# Patient Record
Sex: Male | Born: 1980 | Race: Black or African American | Hispanic: No | Smoking: Current every day smoker
Health system: Southern US, Community
[De-identification: ages and names within clinical notes are randomized; demographics above are authoritative.]

---

## 2004-06-10 ENCOUNTER — Emergency Department (HOSPITAL_COMMUNITY): Admission: EM | Admit: 2004-06-10 | Discharge: 2004-06-10 | Payer: Self-pay | Admitting: Emergency Medicine

## 2005-01-23 ENCOUNTER — Emergency Department (HOSPITAL_COMMUNITY): Admission: EM | Admit: 2005-01-23 | Discharge: 2005-01-23 | Payer: Self-pay | Admitting: Emergency Medicine

## 2005-05-27 ENCOUNTER — Emergency Department (HOSPITAL_COMMUNITY): Admission: EM | Admit: 2005-05-27 | Discharge: 2005-05-27 | Payer: Self-pay | Admitting: Emergency Medicine

## 2005-06-05 ENCOUNTER — Emergency Department (HOSPITAL_COMMUNITY): Admission: EM | Admit: 2005-06-05 | Discharge: 2005-06-05 | Payer: Self-pay | Admitting: Emergency Medicine

## 2005-06-06 ENCOUNTER — Emergency Department (HOSPITAL_COMMUNITY): Admission: EM | Admit: 2005-06-06 | Discharge: 2005-06-06 | Payer: Self-pay | Admitting: Emergency Medicine

## 2005-07-15 ENCOUNTER — Emergency Department (HOSPITAL_COMMUNITY): Admission: EM | Admit: 2005-07-15 | Discharge: 2005-07-16 | Payer: Self-pay | Admitting: Emergency Medicine

## 2005-07-17 ENCOUNTER — Emergency Department (HOSPITAL_COMMUNITY): Admission: EM | Admit: 2005-07-17 | Discharge: 2005-07-18 | Payer: Self-pay | Admitting: *Deleted

## 2005-07-26 ENCOUNTER — Emergency Department (HOSPITAL_COMMUNITY): Admission: EM | Admit: 2005-07-26 | Discharge: 2005-07-26 | Payer: Self-pay | Admitting: Emergency Medicine

## 2006-08-15 ENCOUNTER — Emergency Department (HOSPITAL_COMMUNITY): Admission: EM | Admit: 2006-08-15 | Discharge: 2006-08-15 | Payer: Self-pay | Admitting: Emergency Medicine

## 2007-08-08 ENCOUNTER — Emergency Department (HOSPITAL_COMMUNITY): Admission: EM | Admit: 2007-08-08 | Discharge: 2007-08-08 | Payer: Self-pay | Admitting: Emergency Medicine

## 2007-09-14 ENCOUNTER — Emergency Department (HOSPITAL_COMMUNITY): Admission: EM | Admit: 2007-09-14 | Discharge: 2007-09-15 | Payer: Self-pay | Admitting: Emergency Medicine

## 2008-06-29 ENCOUNTER — Emergency Department (HOSPITAL_COMMUNITY): Admission: EM | Admit: 2008-06-29 | Discharge: 2008-06-29 | Payer: Self-pay | Admitting: Emergency Medicine

## 2008-08-15 ENCOUNTER — Emergency Department (HOSPITAL_COMMUNITY): Admission: EM | Admit: 2008-08-15 | Discharge: 2008-08-15 | Payer: Self-pay | Admitting: Emergency Medicine
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
## 2009-08-24 ENCOUNTER — Emergency Department (HOSPITAL_COMMUNITY): Admission: EM | Admit: 2009-08-24 | Discharge: 2009-08-24 | Payer: Self-pay | Admitting: Emergency Medicine

## 2010-03-04 ENCOUNTER — Emergency Department (HOSPITAL_COMMUNITY): Admission: EM | Admit: 2010-03-04 | Discharge: 2010-03-04 | Payer: Self-pay | Admitting: Emergency Medicine

## 2018-01-19 ENCOUNTER — Encounter (HOSPITAL_COMMUNITY): Payer: Self-pay | Admitting: Emergency Medicine

## 2018-01-19 ENCOUNTER — Emergency Department (HOSPITAL_COMMUNITY)
Admission: EM | Admit: 2018-01-19 | Discharge: 2018-01-19 | Disposition: A | Discharge status: Home / Self Care | Payer: Self-pay | Attending: Emergency Medicine | Admitting: Emergency Medicine

## 2018-01-19 ENCOUNTER — Other Ambulatory Visit: Payer: Self-pay

## 2018-01-19 DIAGNOSIS — F1910 Other psychoactive substance abuse, uncomplicated: Secondary | ICD-10-CM | POA: Insufficient documentation

## 2018-01-19 DIAGNOSIS — F17210 Nicotine dependence, cigarettes, uncomplicated: Secondary | ICD-10-CM | POA: Insufficient documentation

## 2018-01-19 LAB — CBC
HEMATOCRIT: 45.6 % (ref 39.0–52.0)
HEMOGLOBIN: 14.5 g/dL (ref 13.0–17.0)
MCH: 28.1 pg (ref 26.0–34.0)
MCHC: 31.8 g/dL (ref 30.0–36.0)
MCV: 88.4 fL (ref 78.0–100.0)
Platelets: 264 10*3/uL (ref 150–400)
RBC: 5.16 MIL/uL (ref 4.22–5.81)
RDW: 14.5 % (ref 11.5–15.5)
WBC: 4.6 10*3/uL (ref 4.0–10.5)

## 2018-01-19 LAB — COMPREHENSIVE METABOLIC PANEL
ALBUMIN: 4.3 g/dL (ref 3.5–5.0)
ALT: 15 U/L — ABNORMAL LOW (ref 17–63)
ANION GAP: 8 (ref 5–15)
AST: 21 U/L (ref 15–41)
Alkaline Phosphatase: 39 U/L (ref 38–126)
BUN: 13 mg/dL (ref 6–20)
CHLORIDE: 104 mmol/L (ref 101–111)
CO2: 27 mmol/L (ref 22–32)
Calcium: 9.4 mg/dL (ref 8.9–10.3)
Creatinine, Ser: 1.2 mg/dL (ref 0.61–1.24)
GFR calc Af Amer: >60 mL/min (ref >60)
GFR calc non Af Amer: >60 mL/min (ref >60)
GLUCOSE: 111 mg/dL — AB (ref 65–99)
Potassium: 4.5 mmol/L (ref 3.5–5.1)
SODIUM: 139 mmol/L (ref 135–145)
Total Bilirubin: 0.6 mg/dL (ref 0.3–1.2)
Total Protein: 7.4 g/dL (ref 6.5–8.1)

## 2018-01-19 LAB — RAPID URINE DRUG SCREEN, HOSP PERFORMED
AMPHETAMINES: NOT DETECTED
BARBITURATES: NOT DETECTED
Benzodiazepines: NOT DETECTED
COCAINE: POSITIVE — AB
Opiates: NOT DETECTED
TETRAHYDROCANNABINOL: POSITIVE — AB

## 2018-01-19 LAB — ETHANOL: Alcohol, Ethyl (B): <10 mg/dL (ref <10)

## 2018-01-19 NOTE — ED Triage Notes (Signed)
Pt reports he wants help getting into a rehab to become sober. He uses cocaine and marijuana. Last cocaine use was yesterday.

## 2018-01-19 NOTE — ED Provider Notes (Signed)
[facility] EMERGENCY DEPARTMENT Provider Note   CSN: [id number] Arrival date & time: 01/19/18  1105     History   Chief Complaint Chief Complaint  Patient presents with  . Medical Clearance    HPI Mark Manning is a 37 y.o. male.  Patient here requesting inpatient substance abuse treatment.  Patient with significant crack cocaine abuse pattern and also substance abuse with THC.  [facility] the [facility].  Patient denies any suicidal ideations.  Patient last used yesterday.      No past medical history on file.  There are no active problems to display for this patient.       Home Medications    Prior to Admission medications   Not on File    Family History No family history on file.  Social History Social History   Tobacco Use  . Smoking status: Current Every Day Smoker    Packs/day: 1.00  . Smokeless tobacco: Never Used  Substance Use Topics  . Alcohol use: Yes    Comment: occasionally  . Drug use: Yes    Types: Cocaine, Marijuana    Comment: last use 01/18/18     Allergies   Clindamycin/lincomycin   Review of Systems Review of Systems  Constitutional: Negative for fever.  HENT: Negative for congestion.   Eyes: Negative for redness.  Respiratory: Negative for shortness of breath.   Cardiovascular: Negative for chest pain.  Gastrointestinal: Negative for abdominal pain.  Genitourinary: Negative for dysuria.  Musculoskeletal: Negative for myalgias.  Neurological: Negative for headaches.  Hematological: Does not bruise/bleed easily.  Psychiatric/Behavioral: Negative for confusion and suicidal ideas.     Physical Exam Updated Vital Signs BP 120/74   Pulse 74   Temp 98.2 F (36.8 C) (Oral)   Resp 16   Ht 1.778 m (5\' 10" )   Wt 90.7 kg (200 lb)   SpO2 99%   BMI 28.70 kg/m   Physical Exam  Constitutional: He is oriented to person, place, and time. He appears well-developed and well-nourished.  No distress.  HENT:  Head: Normocephalic and atraumatic.  Eyes: Conjunctivae and EOM are normal. Pupils are equal, round, and reactive to light.  Neck: Neck supple.  Cardiovascular: Normal rate, regular rhythm and normal heart sounds.  Pulmonary/Chest: Effort normal and breath sounds normal. No respiratory distress.  Abdominal: Soft. Bowel sounds are normal. There is no tenderness.  Musculoskeletal: Normal range of motion.  Neurological: He is alert and oriented to person, place, and time. No cranial nerve deficit or sensory deficit. He exhibits normal muscle tone. Coordination normal.  Skin: Skin is warm.  Nursing note and vitals reviewed.    ED Treatments / Results  Labs (all labs ordered are listed, but only abnormal results are displayed) Labs Reviewed  COMPREHENSIVE METABOLIC PANEL - Abnormal; Notable for the following components:      Result Value   Glucose, Bld 111 (*)    ALT 15 (*)    All other components within normal limits  RAPID URINE DRUG SCREEN, HOSP PERFORMED - Abnormal; Notable for the following components:   Cocaine POSITIVE (*)    Tetrahydrocannabinol POSITIVE (*)    All other components within normal limits  ETHANOL  CBC    EKG  EKG Interpretation None       Radiology No results found.  Procedures Procedures (including critical care time)  Medications Ordered in ED Medications - No data to display   Initial Impression / Assessment and  Plan / ED Course  I have reviewed the triage vital signs and the nursing notes.  Pertinent labs & imaging results that were available during my care of the patient were reviewed by me and considered in my medical decision making (see chart for details).    Patient requesting inpatient rehab for polysubstance abuse.  But main abuses cocaine.  The requested patient will have behavioral health to an interview.  Patient denies any suicidal ideation.  Patient has been through [facility] in the  past.  Patient medically cleared for behavioral health evaluation.  Final Clinical Impressions(s) / ED Diagnoses   Final diagnoses:  Polysubstance abuse [facility])    ED Discharge Orders    None       Vanetta Mulders, MD 01/19/18 1408

## 2018-01-19 NOTE — BHH Counselor (Signed)
ED requests inpatient SA resources for the Pt. Resources faxed to ED.  Wolfgang PhoenixBrandi Maison Kestenbaum, [facility] Triage Specialist

## 2018-01-19 NOTE — Discharge Instructions (Signed)
Substance Abuse Treatment Programs ° °Intensive Outpatient Programs °[facility]     °[address]      °[place], [place]                   °[phone]      ° °[facility] °[address] °[place], [place] °[phone] ° °[facility]     °(Inpatient and outpatient)     °[address]           °[phone]   ° °[facility] °[phone] (Suboxone and Methadone) ° °[address]      °[place], [place] [address]      °336-882-2125      ° °[address] °[place], [place] °852-3033 ° °[facility] (Outpatient/Inpatient, Chemical)    °(insurance only) [phone]      °       °[facility] (Groups & Residential) °[place], [place] °336-389-1413 ° °   °[facility]     °[address]     °[place], [place]      °[phone]      ° °[facility] (for caregivers and family) °[address] °[place], [place] °336-299-4655 ° ° ° ° ° °Residential Treatment Programs °[facility]      °[address], [place], [place] [address]  °([phone]      ° °[facility] °[address] [place], [place] [address] °[phone] ° °[facility]        °336-248-8914      ° °[facility] °1-800-659-3381 ° °[facility]             °[address]                                         °[place], [place]                                                °877-615-2722 or 336-784-9470                              ° °[facility] [place] °[address] °[place], [address] °1.877.941.8954 ° °[facility]    °[address]      °, [place]     °336-273-5306      ° °[facility] °[address] °, [place] °336-285-9073 ° °[facility]   °[address]     °[place], [place] [address]     °336-899-1550      °Admissions: 8am-3pm M-F ° °[facility] °[address] °[place] °336-227-7417 ° °[facility]: Residential Program (90 Days)   °[place], [place]      °336-725-8389 or 800-758-6077    ° °[facility] °[place], [place] °(Walk in Hours over the weekend or by referral) ° °[facility] °[address], [place], [place] [address] °(336) [phone] ° °Crisis Mobile: [facility]:  1-877-626-1772 (for crisis response 24 hours a day) °[facility] Hotline:      1-800-256-2452 °Outpatient Psychiatry and Counseling ° °[facility]: Mobile Crisis   Management 24 hours:  1-877-626-1772 ° °[facility] sliding scale fee and walk in schedule: M-F 8am-12pm/1pm-3pm °[address]  °[place], [place] [address] °336-387-6161 ° °[facility] °[address] °[place], [place] [address] °336-703-9650 ° °[facility] ([facility])- new patient walk-in appointments available Monday - Friday 8am -3pm.          °[address] °[place], [place] [address] °336-676-6840 or crisis line- 336-676-6905 ° °[facility] Intensive Outpatient Therapy Program °[address] °[place], [place] [address] °336-832-9804 ° °[facility]                  °Crisis Services      °336.641.4993      °[address]     °[place], [place] [address]                ° °[facility]   °[facility] °[phone] °[address] °[place], [place] [address] ° ° °[facility]          °[address]  °[place], [place] [address]       °(336) [phone] ° °[facility] °[address] °[place], [place] [address] °336-292-1510 ° °[facility]    °[address]    °[place], [place] [address]     °336-632-3505      ° °Parish McKinney, MD     °[address]     °[place] [address]     °336-282-1251     °  °[facility] °[address] °[place] [address] ° °[facility]     °[address]     °[place], [place]      °336-542-2076      ° °[facility] °Shamsher Ahluwalia, MD °[address] °[place], [place] [address] °336-420-9558 ° °[facility]     °[address]     °[place], [place] [address]     °336-274-1237      ° °[facility] °[address] °[place], [place] [address] °336-854-4450 °Resources for Temporary Residential Assistance/Crisis Centers ° °DAY CENTERS °[facility] °M-F 8am-3pm   °[address] [place], [place] [address]   336-332-0824 °Services include: laundry, barbering, support groups, case management, phone  & computer access, showers, AA/NA mtgs, mental health/substance abuse nurse, job skills class, disability information, VA assistance, spiritual classes, etc.  ° °HOMELESS SHELTERS ° °[facility]     °[facility]   °[address], [place]     °336.271.5959       °       °[facility] (women and children)       °[address] °[place], [place] [address] °336-275-0820 °[email or web address] for application and process °Application Required ° °[facility]   °[address]    °[place] [address]     °336.886.4922       °             °[facility] °[address] °[place], [place] [address] °336.273.5572 °336-235-0363(schedule application appt.) °Application Required ° °[facility] (women only)    °[address]     °[place], [place] [address]     °336-884-1039      °  Intake starts 6pm daily Need valid ID, SSC, & Police report [facility] [place] [address] [place] [phone] Application Required  [facility] (men only)     [address]      [place], [place]     [phone]       [facility] (Pregnant women only) [address] [place], [place] [phone]  [facility]      [address]      [place], [place] [address]     [phone]             [facility] [address] [place], [place] [phone] 90 day commitment/SA/Application process  [facility](men only)     [address]     [place]     [phone]       Check-in at [facility]7pm            [facility] [address] [place], [place] [phone] Men/Women/Women and Children must be there by 7 pm  [facility] [place] [phone]                  Resource information provided.  [place] [facility] be [facility].  Recommend that she go there tomorrow.  Return for any new or worse symptoms.

## 2021-05-26 ENCOUNTER — Other Ambulatory Visit (HOSPITAL_COMMUNITY): Payer: Self-pay

## 2021-05-26 NOTE — Telephone Encounter (Signed)
Opened in error, please disregard.

## 2022-07-17 DIAGNOSIS — R69 Illness, unspecified: Secondary | ICD-10-CM | POA: Diagnosis not present

## 2024-04-12 DIAGNOSIS — R600 Localized edema: Secondary | ICD-10-CM | POA: Diagnosis not present

## 2024-04-12 DIAGNOSIS — E876 Hypokalemia: Secondary | ICD-10-CM | POA: Diagnosis not present

## 2024-04-12 DIAGNOSIS — Z87891 Personal history of nicotine dependence: Secondary | ICD-10-CM | POA: Diagnosis not present

## 2024-04-12 DIAGNOSIS — K219 Gastro-esophageal reflux disease without esophagitis: Secondary | ICD-10-CM | POA: Diagnosis not present

## 2024-04-12 DIAGNOSIS — M7989 Other specified soft tissue disorders: Secondary | ICD-10-CM | POA: Diagnosis not present

## 2024-04-16 ENCOUNTER — Inpatient Hospital Stay (HOSPITAL_COMMUNITY)
Admission: AD | Admit: 2024-04-16 | Discharge: 2024-04-21 | DRG: 897 | Disposition: A | Discharge status: Home / Self Care | Source: Intra-hospital | Attending: Psychiatry | Admitting: Psychiatry

## 2024-04-16 ENCOUNTER — Encounter (HOSPITAL_COMMUNITY): Payer: Self-pay | Admitting: Nurse Practitioner

## 2024-04-16 ENCOUNTER — Ambulatory Visit (HOSPITAL_COMMUNITY)
Admission: EM | Admit: 2024-04-16 | Discharge: 2024-04-16 | Disposition: A | Discharge status: Other Acute Inpatient Hospital | Attending: Urology | Admitting: Urology

## 2024-04-16 ENCOUNTER — Other Ambulatory Visit: Payer: Self-pay

## 2024-04-16 DIAGNOSIS — F17210 Nicotine dependence, cigarettes, uncomplicated: Secondary | ICD-10-CM | POA: Diagnosis not present

## 2024-04-16 DIAGNOSIS — F419 Anxiety disorder, unspecified: Secondary | ICD-10-CM | POA: Diagnosis not present

## 2024-04-16 DIAGNOSIS — G4700 Insomnia, unspecified: Secondary | ICD-10-CM | POA: Diagnosis not present

## 2024-04-16 DIAGNOSIS — Z5941 Food insecurity: Secondary | ICD-10-CM | POA: Diagnosis not present

## 2024-04-16 DIAGNOSIS — Z653 Problems related to other legal circumstances: Secondary | ICD-10-CM | POA: Insufficient documentation

## 2024-04-16 DIAGNOSIS — F1210 Cannabis abuse, uncomplicated: Secondary | ICD-10-CM | POA: Diagnosis not present

## 2024-04-16 DIAGNOSIS — F1994 Other psychoactive substance use, unspecified with psychoactive substance-induced mood disorder: Principal | ICD-10-CM | POA: Diagnosis present

## 2024-04-16 DIAGNOSIS — Z5982 Transportation insecurity: Secondary | ICD-10-CM

## 2024-04-16 DIAGNOSIS — F15980 Other stimulant use, unspecified with stimulant-induced anxiety disorder: Secondary | ICD-10-CM | POA: Diagnosis not present

## 2024-04-16 DIAGNOSIS — Z5986 Financial insecurity: Secondary | ICD-10-CM

## 2024-04-16 DIAGNOSIS — K219 Gastro-esophageal reflux disease without esophagitis: Secondary | ICD-10-CM | POA: Diagnosis present

## 2024-04-16 DIAGNOSIS — F1910 Other psychoactive substance abuse, uncomplicated: Secondary | ICD-10-CM | POA: Diagnosis present

## 2024-04-16 DIAGNOSIS — F1410 Cocaine abuse, uncomplicated: Secondary | ICD-10-CM | POA: Diagnosis present

## 2024-04-16 DIAGNOSIS — F22 Delusional disorders: Principal | ICD-10-CM | POA: Diagnosis present

## 2024-04-16 DIAGNOSIS — Z881 Allergy status to other antibiotic agents status: Secondary | ICD-10-CM

## 2024-04-16 DIAGNOSIS — F32A Depression, unspecified: Secondary | ICD-10-CM | POA: Diagnosis not present

## 2024-04-16 DIAGNOSIS — Z79899 Other long term (current) drug therapy: Secondary | ICD-10-CM

## 2024-04-16 DIAGNOSIS — Z560 Unemployment, unspecified: Secondary | ICD-10-CM | POA: Diagnosis not present

## 2024-04-16 LAB — COMPREHENSIVE METABOLIC PANEL WITH GFR
ALT: 20 U/L (ref 0–44)
AST: 25 U/L (ref 15–41)
Albumin: 3.4 g/dL — ABNORMAL LOW (ref 3.5–5.0)
Alkaline Phosphatase: 43 U/L (ref 38–126)
Anion gap: 8 (ref 5–15)
BUN: 10 mg/dL (ref 6–20)
CO2: 29 mmol/L (ref 22–32)
Calcium: 9.1 mg/dL (ref 8.9–10.3)
Chloride: 105 mmol/L (ref 98–111)
Creatinine, Ser: 1.18 mg/dL (ref 0.61–1.24)
GFR, Estimated: >60 mL/min (ref >60)
Glucose, Bld: 105 mg/dL — ABNORMAL HIGH (ref 70–99)
Potassium: 3.8 mmol/L (ref 3.5–5.1)
Sodium: 142 mmol/L (ref 135–145)
Total Bilirubin: 0.4 mg/dL (ref 0.0–1.2)
Total Protein: 6.6 g/dL (ref 6.5–8.1)

## 2024-04-16 LAB — CBC WITH DIFFERENTIAL/PLATELET
Abs Immature Granulocytes: 0.01 10*3/uL (ref 0.00–0.07)
Basophils Absolute: 0 10*3/uL (ref 0.0–0.1)
Basophils Relative: 0 %
Eosinophils Absolute: 0.1 10*3/uL (ref 0.0–0.5)
Eosinophils Relative: 2 %
HCT: 37.9 % — ABNORMAL LOW (ref 39.0–52.0)
Hemoglobin: 11.8 g/dL — ABNORMAL LOW (ref 13.0–17.0)
Immature Granulocytes: 0 %
Lymphocytes Relative: 28 %
Lymphs Abs: 1.5 10*3/uL (ref 0.7–4.0)
MCH: 28.4 pg (ref 26.0–34.0)
MCHC: 31.1 g/dL (ref 30.0–36.0)
MCV: 91.1 fL (ref 80.0–100.0)
Monocytes Absolute: 0.6 10*3/uL (ref 0.1–1.0)
Monocytes Relative: 11 %
Neutro Abs: 3.2 10*3/uL (ref 1.7–7.7)
Neutrophils Relative %: 59 %
Platelets: 304 10*3/uL (ref 150–400)
RBC: 4.16 MIL/uL — ABNORMAL LOW (ref 4.22–5.81)
RDW: 13.2 % (ref 11.5–15.5)
WBC: 5.4 10*3/uL (ref 4.0–10.5)
nRBC: 0 % (ref 0.0–0.2)

## 2024-04-16 LAB — LIPID PANEL
Cholesterol: 126 mg/dL (ref 0–200)
HDL: 46 mg/dL (ref >40)
LDL Cholesterol: 62 mg/dL (ref 0–99)
Total CHOL/HDL Ratio: 2.7 ratio
Triglycerides: 88 mg/dL (ref <150)
VLDL: 18 mg/dL (ref 0–40)

## 2024-04-16 LAB — POCT URINE DRUG SCREEN - MANUAL ENTRY (I-SCREEN)
POC Amphetamine UR: NOT DETECTED
POC Buprenorphine (BUP): NOT DETECTED
POC Cocaine UR: POSITIVE — AB
POC Marijuana UR: POSITIVE — AB
POC Methadone UR: NOT DETECTED
POC Methamphetamine UR: NOT DETECTED
POC Morphine: NOT DETECTED
POC Oxazepam (BZO): NOT DETECTED
POC Oxycodone UR: NOT DETECTED
POC Secobarbital (BAR): NOT DETECTED

## 2024-04-16 LAB — TSH: TSH: 0.501 u[IU]/mL (ref 0.350–4.500)

## 2024-04-16 LAB — VITAMIN D 25 HYDROXY (VIT D DEFICIENCY, FRACTURES): Vit D, 25-Hydroxy: 17.61 ng/mL — ABNORMAL LOW (ref 30–100)

## 2024-04-16 LAB — ETHANOL: Alcohol, Ethyl (B): <15 mg/dL (ref <15)

## 2024-04-16 MED ORDER — FLUOXETINE HCL 10 MG PO CAPS
10.0000 mg | ORAL_CAPSULE | Freq: Every day | ORAL | Status: DC
  Administered 2024-04-16 – 2024-04-17 (×2): 10 mg via ORAL
  Filled 2024-04-16 (×2): qty 1

## 2024-04-16 MED ORDER — TRAZODONE HCL 50 MG PO TABS
50.0000 mg | ORAL_TABLET | Freq: Every evening | ORAL | Status: DC | PRN
  Administered 2024-04-16: 50 mg via ORAL
  Filled 2024-04-16: qty 1

## 2024-04-16 MED ORDER — ALUM & MAG HYDROXIDE-SIMETH 200-200-20 MG/5ML PO SUSP: 30.0000 mL | ORAL | Status: DC | PRN

## 2024-04-16 MED ORDER — HALOPERIDOL 5 MG PO TABS: 5.0000 mg | ORAL_TABLET | Freq: Three times a day (TID) | ORAL | Status: DC | PRN

## 2024-04-16 MED ORDER — HYDROXYZINE HCL 25 MG PO TABS
25.0000 mg | ORAL_TABLET | Freq: Three times a day (TID) | ORAL | Status: DC | PRN
  Administered 2024-04-17 – 2024-04-20 (×4): 25 mg via ORAL
  Filled 2024-04-16 (×3): qty 1
  Filled 2024-04-16: qty 10
  Filled 2024-04-16: qty 1

## 2024-04-16 MED ORDER — LORAZEPAM 2 MG/ML IJ SOLN: 2.0000 mg | Freq: Three times a day (TID) | INTRAMUSCULAR | Status: DC | PRN

## 2024-04-16 MED ORDER — OLANZAPINE 10 MG IM SOLR: 5.0000 mg | Freq: Three times a day (TID) | INTRAMUSCULAR | Status: DC | PRN

## 2024-04-16 MED ORDER — OLANZAPINE 5 MG PO TABS: 5.0000 mg | ORAL_TABLET | Freq: Every day | ORAL | Status: DC

## 2024-04-16 MED ORDER — OLANZAPINE 5 MG PO TBDP: 5.0000 mg | ORAL_TABLET | Freq: Three times a day (TID) | ORAL | Status: DC | PRN

## 2024-04-16 MED ORDER — ACETAMINOPHEN 325 MG PO TABS
650.0000 mg | ORAL_TABLET | Freq: Four times a day (QID) | ORAL | Status: DC | PRN
  Administered 2024-04-16: 650 mg via ORAL
  Filled 2024-04-16: qty 2

## 2024-04-16 MED ORDER — HALOPERIDOL LACTATE 5 MG/ML IJ SOLN: 5.0000 mg | Freq: Three times a day (TID) | INTRAMUSCULAR | Status: DC | PRN

## 2024-04-16 MED ORDER — OLANZAPINE 5 MG PO TABS
5.0000 mg | ORAL_TABLET | Freq: Every day | ORAL | Status: DC
  Administered 2024-04-16: 5 mg via ORAL
  Filled 2024-04-16: qty 1

## 2024-04-16 MED ORDER — DIPHENHYDRAMINE HCL 50 MG/ML IJ SOLN: 50.0000 mg | Freq: Three times a day (TID) | INTRAMUSCULAR | Status: DC | PRN

## 2024-04-16 MED ORDER — HYDROXYZINE HCL 25 MG PO TABS
25.0000 mg | ORAL_TABLET | Freq: Three times a day (TID) | ORAL | Status: DC | PRN
  Administered 2024-04-16: 25 mg via ORAL
  Filled 2024-04-16: qty 1

## 2024-04-16 MED ORDER — HYDROXYZINE HCL 25 MG PO TABS: 25.0000 mg | ORAL_TABLET | Freq: Three times a day (TID) | ORAL | Status: DC | PRN

## 2024-04-16 MED ORDER — ACETAMINOPHEN 325 MG PO TABS
650.0000 mg | ORAL_TABLET | Freq: Four times a day (QID) | ORAL | Status: DC | PRN
  Administered 2024-04-16 – 2024-04-18 (×3): 650 mg via ORAL
  Filled 2024-04-16 (×3): qty 2

## 2024-04-16 MED ORDER — MAGNESIUM HYDROXIDE 400 MG/5ML PO SUSP: 30.0000 mL | Freq: Every day | ORAL | Status: DC | PRN

## 2024-04-16 MED ORDER — DIPHENHYDRAMINE HCL 50 MG PO CAPS
50.0000 mg | ORAL_CAPSULE | Freq: Three times a day (TID) | ORAL | Status: DC | PRN
Start: 2024-04-16 — End: 2024-04-16

## 2024-04-16 MED ORDER — HALOPERIDOL LACTATE 5 MG/ML IJ SOLN: 10.0000 mg | Freq: Three times a day (TID) | INTRAMUSCULAR | Status: DC | PRN

## 2024-04-16 MED ORDER — TRAZODONE HCL 50 MG PO TABS
50.0000 mg | ORAL_TABLET | Freq: Every evening | ORAL | Status: DC | PRN
  Administered 2024-04-16 – 2024-04-19 (×4): 50 mg via ORAL
  Filled 2024-04-16 (×3): qty 1
  Filled 2024-04-16: qty 7
  Filled 2024-04-16 (×2): qty 1
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
## 2024-04-16 NOTE — Group Note (Signed)
 Date:  04/16/2024 Time:  5:19 PM  Group Topic/Focus:   Managing Stress:   The focus of this group is to identify ways to manage stress and how patients can apply these strategies in their personal lives.     Participation Level:  Did Not Attend   Sheryl Donna 04/16/2024, 5:19 PM
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
## 2024-04-16 NOTE — BHH Group Notes (Signed)
 BHH Group Notes:  (Nursing/MHT/Case Management/Adjunct)  Date:  04/16/2024  Time:  2000  Type of Therapy:  Wrap up group  Participation Level:  Active  Participation Quality:  Appropriate, Attentive, Sharing, and Supportive  Affect:  Appropriate  Cognitive:  Alert  Insight:  Improving  Engagement in Group:  Engaged  Modes of Intervention:  Clarification, Education, and Support  Summary of Progress/Problems: Positive thinking and positive change were discussed.   Catharine Clock 04/16/2024, 9:55 PM

## 2024-04-16 NOTE — Plan of Care (Signed)
  Problem: Education: Goal: Knowledge of [facility] [place] Education information/materials will improve Outcome: Progressing Goal: Emotional status will improve Outcome: Progressing Goal: Verbalization of understanding the information provided will improve Outcome: Progressing   Problem: Activity: Goal: Interest or engagement in activities will improve Outcome: Progressing   Problem: Coping: Goal: Ability to verbalize frustrations and anger appropriately will improve Outcome: Progressing   Problem: Health Behavior/Discharge Planning: Goal: Identification of resources available to assist in meeting health care needs will improve Outcome: Progressing   Problem: Physical Regulation: Goal: Ability to maintain clinical measurements within normal limits will improve Outcome: Progressing   Problem: Safety: Goal: Periods of time without injury will increase Outcome: Progressing

## 2024-04-16 NOTE — Discharge Instructions (Addendum)
 Accepted to [facility] unit under the care of Dr. Zouvi

## 2024-04-16 NOTE — Tx Team (Signed)
 Initial Treatment Plan 04/16/2024 1:14 PM Bolling Bushy ZOX:[id number]    PATIENT STRESSORS: Financial difficulties   Substance abuse     PATIENT STRENGTHS: Capable of independent living  Printmaker for treatment/growth  Religious Affiliation  Work skills    PATIENT IDENTIFIED PROBLEMS: Financial problems  Substance abuse  Paranoia                  DISCHARGE CRITERIA:  Improved stabilization in mood, thinking, and/or behavior Need for constant or close observation no longer present  PRELIMINARY DISCHARGE PLAN: Return to previous living arrangement  PATIENT/FAMILY INVOLVEMENT: This treatment plan has been presented to and reviewed with the patient, Mark Manning.  The patient has been given the opportunity to ask questions and make suggestions.  Christinia Cradle, RN 04/16/2024, 1:14 PM

## 2024-04-16 NOTE — ED Notes (Signed)
 Called and set up safe transport.

## 2024-04-16 NOTE — BH Assessment (Signed)
 Comprehensive Clinical Assessment (CCA) Note  04/16/2024 Mark Manning [id number]  Chief Complaint:  Chief Complaint  Patient presents with   Evaluation  Disposition: Per Mark Ida Ajibola,NP patient is recommended overnight observation.  The patient demonstrates the following risk factors for suicide: Chronic risk factors for suicide include: substance use disorder. Acute risk factors for suicide include: N/A. Protective factors for this patient include: hope for the future. Considering these factors, the overall suicide risk at this point appears to be low. Patient is appropriate for outpatient follow up.   Mark Manning is a 43 year old male with a history of polysubstance abuse who presents voluntarily to [facility] for an assessment. Patient He reports paranoia feeling like people are out to get him. He states "they are so sophisicated they won't get caught". He states he believes someone told a lie on him and that's why the people are after him. He states "my family thinks I'm crazy because I won't say names". Patient reports isolation, irritability,lack of motivation, decreased appetite and unstable sleeping patterns. He reports he is overwhelmed due to his current situation.He states his landlord accused him of breaching his rental contract because he did not pay the first month of rent because he was in jail at the time. He reports being incarcerated from 02/18/24-04/06/24, he did not disclose his charges but states he is on probation. He states his apartment situation has caused a great deal of stress.    Patient states is not established with outpatient therapy or psychiatry services per his report. He states he is not currently prescribed medication for his symptoms.He denies history of past suicide attempts. He denies past inpatient admissions. He denies access to weapons. He denies history of abuse or trauma.Patient denies SI/HI and AVH.      Visit Diagnosis:    Paranoia   CCA Screening, Triage and Referral (STR)  Patient Reported Information How did you hear about us ? Self  What Is the Reason for Your Visit/Call Today? Per triage note "Mark Manning arrived to the [facility] voluntarily wanting an evaluation. He states that he feels unsafe where he lives and that he has a lot of physical pain. Most of his anxiousness stems from his situation. He states that he was hacked during a cyber attack and he lost his job behind it." He reports paranoia feeling like people are out to get him. He states "they are so sophisicated they won't get caught". He states he believes someone told a lie on him and that's why the people are after him. He states "my family thinks I'm crazy because I won't say names". Patient denies SI/HI and AVH.  How Long Has This Been Causing You Problems? 1-6 months  What Do You Feel Would Help You the Most Today? Stress Management; Social Support   Have You Recently Had Any Thoughts About Hurting Yourself? No  Are You Planning to Commit Suicide/Harm Yourself At This time? No   Flowsheet Row ED from [facility]/16/20[facility] in [facility]  C-SSRS RISK CATEGORY No Risk       Have you Recently Had Thoughts About Hurting Someone Mark Manning? No  Are You Planning to Harm Someone at This Time? No  Explanation: denies HI   Have You Used Any Alcohol or Drugs in the Past 24 Hours? Yes  How Long Ago Did You Use Drugs or Alcohol? May 29th per his report  What Did You Use and How Much? alcohol, THC and cocaine, unknown amount  May 29th per his report   Do You Currently Have a Therapist/Psychiatrist? No  Name of Therapist/Psychiatrist:    Have You Been Recently Discharged From Any Office Practice or Programs? No  Explanation of Discharge From Practice/Program: n/a    CCA Screening Triage Referral Assessment Type of Contact: Face-to-Face  Telemedicine Service Delivery:   Is this Initial or Reassessment?   Date  Telepsych consult ordered in [facility]:    Time Telepsych consult ordered in [facility]:    Location of Assessment: [facility] Assessment Services  Provider Location: [facility] Assessment [facility]   Collateral Involvement: n/a   Does Patient Have a Automotive engineer Guardian? -- (n/a)  Legal Guardian Contact Information: n/a  Copy of Legal Guardianship Form: -- (n/a)  Legal Guardian Notified of Arrival: -- (n/a)  Legal Guardian Notified of Pending Discharge: -- (n/a)  If Minor and Not Living with Parent(s), Who has Custody? n/a  Is CPS involved or ever been involved? Never  Is APS involved or ever been involved? Never   Patient Determined To Be At Risk for Harm To Self or Others Based on Review of Patient Reported Information or Presenting Complaint? No  Method: No Plan  Availability of Means: No access or NA  Intent: Vague intent or NA  Notification Required: No need or identified person  Additional Information for Danger to Others Potential: -- (n/a)  Additional Comments for Danger to Others Potential: denies HI  Are There Guns or Other Weapons in Your Home? No  Types of Guns/Weapons: denies access to weapons  Are These Weapons Safely Secured?                            -- (denies access to weapons)  Who Could Verify You Are Able To Have These Secured: denies access to weapons  Do You Have any Outstanding Charges, Pending Court Dates, Parole/Probation? reports he has a court date on 04/18/24 for breaching his rental contract  Contacted To Inform of Risk of Harm To Self or Others: Other: Comment (n/a)    Does Patient Present under Involuntary Commitment? No    [place] of Residence: [place]   Patient Currently Receiving the Following Services: Not Receiving Services   Determination of Need: Urgent (48 hours)   Options For Referral: Medication Management; BH Urgent Care; Outpatient Therapy     CCA Biopsychosocial Patient Reported Schizophrenia/Schizoaffective  Diagnosis in Past: No   Strengths: Cooperation in assessment   Mental Health Symptoms Depression:  Difficulty Concentrating; Sleep (too much or little); Increase/decrease in appetite   Duration of Depressive symptoms: Duration of Depressive Symptoms: Greater than two weeks   Mania:  Racing thoughts   Anxiety:   Tension; Worrying; Restlessness   Psychosis:  Delusions   Duration of Psychotic symptoms: Duration of Psychotic Symptoms: N/A   Trauma:  N/A   Obsessions:  N/A   Compulsions:  N/A   Inattention:  N/A   Hyperactivity/Impulsivity:  N/A   Oppositional/Defiant Behaviors:  N/A   Emotional Irregularity:  N/A   Other Mood/Personality Symptoms:  n/a    Mental Status Exam Appearance and self-care  Stature:  Average   Weight:  Average weight   Clothing:  Casual   Grooming:  Normal   Cosmetic use:  None   Posture/gait:  Tense   Motor activity:  Not Remarkable   Sensorium  Attention:  Normal   Concentration:  Normal   Orientation:  X5   Recall/memory:  Normal  Affect and Mood  Affect:  Anxious   Mood:  Anxious   Relating  Eye contact:  Normal   Facial expression:  Responsive   Attitude toward examiner:  Cooperative   Thought and Language  Speech flow: Clear and Coherent   Thought content:  Appropriate to Mood and Circumstances   Preoccupation:  Ruminations   Hallucinations:  None   Organization:  Coherent   [employer] of Knowledge:  Average   Intelligence:  Average   Abstraction:  Normal   Judgement:  Impaired   Reality Testing:  Distorted; Variable   Insight:  Fair   Decision Making:  Impulsive   Social Functioning  Social Maturity:  Isolates   Social Judgement:  Normal   Stress  Stressors:  Family conflict; Housing   Coping Ability:  Overwhelmed   Skill Deficits:  Self-care   Supports:  Family; Support needed     Religion: Religion/Spirituality Are You A Religious Person?: No How Might  This Affect Treatment?: n/a  Leisure/Recreation: Leisure / Recreation Do You Have Hobbies?: Yes Leisure and Hobbies: listening to [employer], writing  Exercise/Diet: Exercise/Diet Do You Exercise?: Yes What Type of Exercise Do You Do?: Other (Comment) (push-ups) How Many Times a Week Do You Exercise?: 1-3 times a week Have You Gained or Lost A Significant Amount of Weight in the Past Six Months?: No Do You Follow a Special Diet?: No Do You Have Any Trouble Sleeping?: Yes Explanation of Sleeping Difficulties: reports unstable sleeping patterns   CCA Employment/Education Employment/Work Situation: Employment / Work Situation Employment Situation: Unemployed Patient's Job has Been Impacted by Current Illness: No Has Patient ever Been in Equities trader?: No  Education: Education Is Patient Currently Attending School?: No Last Grade Completed: 12 Did You Product manager?: No Did You Have An Individualized Education Program (IIEP): No Did You Have Any Difficulty At [employer]?: No Patient's Education Has Been Impacted by Current Illness: No   CCA Family/Childhood History Family and Relationship History: Family history Marital status: Single Does patient have children?: Yes How many children?: 3 How is patient's relationship with their children?: good  Childhood History:  Childhood History By whom was/is the patient raised?: Other (Comment) (n/a) Did patient suffer any verbal/emotional/physical/sexual abuse as a child?: No Did patient suffer from severe childhood neglect?: No Has patient ever been sexually abused/assaulted/raped as an adolescent or adult?: No Was the patient ever a victim of a crime or a disaster?: No Witnessed domestic violence?: No Has patient been affected by domestic violence as an adult?: No       CCA Substance Use Alcohol/Drug Use: Alcohol / Drug Use Pain Medications: N/A Prescriptions: N/A Over the Counter: N/A History of alcohol / drug  use?: Yes (Reports history of cocaine,THC and alcohol use) Longest period of sobriety (when/how long): UTA. Reports last use of any substance was May 29th when he was released from jail                         ASAM's:  Six Dimensions of Multidimensional Assessment  Dimension 1:  Acute Intoxication and/or Withdrawal Potential:      Dimension 2:  Biomedical Conditions and Complications:      Dimension 3:  Emotional, Behavioral, or Cognitive Conditions and Complications:     Dimension 4:  Readiness to Change:     Dimension 5:  Relapse, Continued use, or Continued Problem Potential:     Dimension 6:  Recovery/Living Environment:  ASAM Severity Score:    ASAM Recommended Level of Treatment:     Substance use Disorder (SUD)    Recommendations for Services/Supports/Treatments:    Disposition Recommendation per psychiatric provider: Per Ene Ajibola,NP patient is recommended overnight observation.   DSM5 Diagnoses: There are no active problems to display for this patient.    Referrals to Alternative Service(s): Referred to Alternative Service(s):   Place:   Date:   Time:    Referred to Alternative Service(s):   Place:   Date:   Time:    Referred to Alternative Service(s):   Place:   Date:   Time:    Referred to Alternative Service(s):   Place:   Date:   Time:     Maurizio Geno C Kartel Wolbert, LCMHCA

## 2024-04-16 NOTE — ED Provider Notes (Addendum)
 [facility] Continuous Assessment Admission H&P  Date: 04/16/24 Patient Name: Mark Manning MRN: [id number] Chief Complaint: my family wants me to get an evaluation.   Diagnoses:  Final diagnoses:  Cocaine abuse (HCC)  Cannabis abuse  Acute paranoia (HCC)    HPI: Mark Manning is a 43 year old male with a history of polysubstance abuse, who [facility] for a mental health evaluation.    Patient was seem face to face and his chart was reviewed by this NP. On assessment, he reports feeling paranoid and believes that people are following him, stating, "there was a cyber-attack and my phone was hacked. Now people are following me." He describes persistent distress due to the sensation of being monitored, stating that he is unable to stay inside his apartment because he believes individuals are watching his every move. He expresses that the individuals monitoring him are highly sophisticated and won't get caught. He further states, "I think someone lied about me, and that's why these people are after me."  He significant difficulty sleeping and reports walking aimlessly to avoid detection, stating that his feet are swollen and tired from walking. He says he is overwhelmed by his situation and expressed frustration that his family does not believe him and that they think he is "crazy" for refusing to name the people he believes are after him. Patient denies suicidal ideation, homicidal ideation, and auditory/visual hallucinations. He acknowledges a history of substance use, including cocaine, marijuana, and alcohol, with the last use being on May 29th,immediately following his release from jail. He reports being incarcerated from April 11th to May 29th 2025 for possession of cocaine. UDS this admission is positive for cocaine and marijuana.  He is alert, oriented, cooperative but restless. His speech is normal in rate and volume, though  slightly pressured due to anxiety. His mood is described as "overwhelmed" and "frustrated," with an affect congruent to his mood. Thought process is disorganized, with paranoid and delusional content, particularly about being followed and his phone being hacked. He denies auditory or visual hallucinations. Cognition is intact; he is fully oriented to time, place, and person. Insight is limited, judgment is impaired   Total Time spent with patient: 30 minutes  Musculoskeletal  Strength & Muscle Tone: within normal limits Gait & Station: normal Patient leans: Right  Psychiatric Specialty Exam  Presentation General Appearance:  Casual  Eye Contact: Good  Speech: Clear and Coherent  Speech Volume: Normal  Handedness: Right   Mood and Affect  Mood: Anxious  Affect: Congruent   Thought Process  Thought Processes: Linear  Descriptions of Associations:Loose  Orientation:Full (Time, Place and Person)  Thought Content:Paranoid Ideation  Diagnosis of Schizophrenia or Schizoaffective disorder in past: No   Hallucinations:Hallucinations: None  Ideas of Reference:Paranoia  Suicidal Thoughts:Suicidal Thoughts: No  Homicidal Thoughts:Homicidal Thoughts: No   Sensorium  Memory: Immediate Good; Recent Fair; Remote Fair  Judgment: Impaired  Insight: Lacking   Executive Functions  Concentration: Good  Attention Span: Good  Recall: Fair  Fund of Knowledge: Good  Language: Good   Psychomotor Activity  Psychomotor Activity: Psychomotor Activity: Normal   Assets  Assets: Communication Skills; Desire for Improvement; Social Support   Sleep  Sleep: Sleep: Poor Number of Hours of Sleep: 0   Nutritional Assessment (For OBS and FBC admissions only) Has the patient had a weight loss or gain of 10 pounds or more in the last 3 months?: No Has the patient had a decrease in  food intake/or appetite?: No Does the patient have dental problems?:  No Does the patient have eating habits or behaviors that may be indicators of an eating disorder including binging or inducing vomiting?: No Has the patient recently lost weight without trying?: 0 Has the patient been eating poorly because of a decreased appetite?: 0 Malnutrition Screening Tool Score: 0    Physical Exam Vitals and nursing note reviewed.  Constitutional:      General: He is not in acute distress.    Appearance: He is well-developed.  HENT:     Head: Normocephalic and atraumatic.  Eyes:     Conjunctiva/sclera: Conjunctivae normal.  Cardiovascular:     Rate and Rhythm: Normal rate.  Pulmonary:     Effort: Pulmonary effort is normal.  Musculoskeletal:        General: Swelling and tenderness present. Normal range of motion.     Cervical back: Normal range of motion and neck supple.     Right lower leg: Edema present.     Left lower leg: Edema present.  Skin:    Capillary Refill: Capillary refill takes less than 2 seconds.  Neurological:     Mental Status: He is alert and oriented to person, place, and time.  Psychiatric:        Attention and Perception: Attention and perception normal.        Mood and Affect: Mood is anxious.        Speech: Speech normal.        Behavior: Behavior normal. Behavior is cooperative.        Thought Content: Thought content is paranoid.    Review of Systems  Constitutional: Negative.   HENT: Negative.    Eyes: Negative.   Respiratory: Negative.    Cardiovascular: Negative.   Gastrointestinal: Negative.   Genitourinary: Negative.   Musculoskeletal: Negative.   Skin: Negative.   Neurological: Negative.   Endo/Heme/Allergies: Negative.   Psychiatric/Behavioral:  Positive for substance abuse. The patient is nervous/anxious.     Blood pressure 123/83, pulse 89, resp. rate 16, SpO2 100%. There is no height or weight on file to calculate BMI.  Past Psychiatric History: Polysubstance abuse: cocaine, marijuana   Is the patient  at risk to self? No  Has the patient been a risk to self in the past 6 months? No .    Has the patient been a risk to self within the distant past? No   Is the patient a risk to others? No   Has the patient been a risk to others in the past 6 months? No   Has the patient been a risk to others within the distant past? No   Past Medical History: none reported  Family History:  None reported  Social History:  Social History   Tobacco Use   Smoking status: Every Day    Current packs/day: 1.00    Types: Cigarettes   Smokeless tobacco: Never  Vaping Use   Vaping status: Former  Substance Use Topics   Alcohol use: Yes    Comment: occasionally   Drug use: Yes    Types: Cocaine, Marijuana    Comment: last use 01/18/18     Last Labs:  Admission on 04/16/2024  Component Date Value Ref Range Status   WBC 04/16/2024 5.4  4.0 - 10.5 K/uL Final   RBC 04/16/2024 4.16 (L)  4.22 - 5.81 MIL/uL Final   Hemoglobin 04/16/2024 11.8 (L)  13.0 - 17.0 g/dL Final   HCT 40/98/1191 37.9 (  L)  39.0 - 52.0 % Final   MCV 04/16/2024 91.1  80.0 - 100.0 fL Final   MCH 04/16/2024 28.4  26.0 - 34.0 pg Final   MCHC 04/16/2024 31.1  30.0 - 36.0 g/dL Final   RDW 57/84/6962 13.2  11.5 - 15.5 % Final   Platelets 04/16/2024 304  150 - 400 K/uL Final   nRBC 04/16/2024 0.0  0.0 - 0.2 % Final   Neutrophils Relative % 04/16/2024 59  % Final   Neutro Abs 04/16/2024 3.2  1.7 - 7.7 K/uL Final   Lymphocytes Relative 04/16/2024 28  % Final   Lymphs Abs 04/16/2024 1.5  0.7 - 4.0 K/uL Final   Monocytes Relative 04/16/2024 11  % Final   Monocytes Absolute 04/16/2024 0.6  0.1 - 1.0 K/uL Final   Eosinophils Relative 04/16/2024 2  % Final   Eosinophils Absolute 04/16/2024 0.1  0.0 - 0.5 K/uL Final   Basophils Relative 04/16/2024 0  % Final   Basophils Absolute 04/16/2024 0.0  0.0 - 0.1 K/uL Final   Immature Granulocytes 04/16/2024 0  % Final   Abs Immature Granulocytes 04/16/2024 0.01  0.00 - 0.07 K/uL Final   Performed  at [facility], [address], [place], [place] [address]   Sodium 04/16/2024 142  135 - 145 mmol/L Final   Potassium 04/16/2024 3.8  3.5 - 5.1 mmol/L Final   Chloride 04/16/2024 105  98 - 111 mmol/L Final   CO2 04/16/2024 29  22 - 32 mmol/L Final   Glucose, Bld 04/16/2024 105 (H)  70 - 99 mg/dL Final   Glucose reference range applies only to samples taken after fasting for at least 8 hours.   BUN 04/16/2024 10  6 - 20 mg/dL Final   Creatinine, Ser 04/16/2024 1.18  0.61 - 1.24 mg/dL Final   Calcium 13/24/4010 9.1  8.9 - 10.3 mg/dL Final   Total Protein 27/25/3664 6.6  6.5 - 8.1 g/dL Final   Albumin 40/34/7425 3.4 (L)  3.5 - 5.0 g/dL Final   AST 95/63/8756 25  15 - 41 U/L Final   ALT 04/16/2024 20  0 - 44 U/L Final   Alkaline Phosphatase 04/16/2024 43  38 - 126 U/L Final   Total Bilirubin 04/16/2024 0.4  0.0 - 1.2 mg/dL Final   GFR, Estimated 04/16/2024 >60  >60 mL/min Final   Comment: (NOTE) Calculated using the CKD-EPI Creatinine Equation (2021)    Anion gap 04/16/2024 8  5 - 15 Final   Performed at [facility], [address], [place], [place] [address]   Alcohol, Ethyl (B) 04/16/2024 <15  <15 mg/dL Final   Comment: (NOTE) For medical purposes only. Performed at [facility], [address], [place], [place] [address]    POC Amphetamine UR 04/16/2024 None Detected  NONE DETECTED (Cut Off Level 1000 ng/mL) Final   POC Secobarbital (BAR) 04/16/2024 None Detected  NONE DETECTED (Cut Off Level 300 ng/mL) Final   POC Buprenorphine (BUP) 04/16/2024 None Detected  NONE DETECTED (Cut Off Level 10 ng/mL) Final   POC Oxazepam (BZO) 04/16/2024 None Detected  NONE DETECTED (Cut Off Level 300 ng/mL) Final   POC Cocaine UR 04/16/2024 Positive (A)  NONE DETECTED (Cut Off Level 300 ng/mL) Final   POC Methamphetamine UR 04/16/2024 None Detected  NONE DETECTED (Cut Off Level 1000 ng/mL) Final   POC Morphine 04/16/2024 None Detected  NONE DETECTED (Cut Off Level 300 ng/mL) Final    POC Methadone UR  04/16/2024 None Detected  NONE DETECTED (Cut Off Level 300 ng/mL) Final   POC Oxycodone UR 04/16/2024 None Detected  NONE DETECTED (Cut Off Level 100 ng/mL) Final   POC Marijuana UR 04/16/2024 Positive (A)  NONE DETECTED (Cut Off Level 50 ng/mL) Final    Allergies: Clindamycin/lincomycin  Medications:  Facility Ordered Medications  Medication   acetaminophen (TYLENOL) tablet 650 mg   alum & mag hydroxide-simeth (MAALOX/MYLANTA) 200-200-20 MG/5ML suspension 30 mL   magnesium hydroxide (MILK OF MAGNESIA) suspension 30 mL   haloperidol (HALDOL) tablet 5 mg   And   diphenhydrAMINE (BENADRYL) capsule 50 mg   haloperidol lactate (HALDOL) injection 5 mg   And   diphenhydrAMINE (BENADRYL) injection 50 mg   And   LORazepam (ATIVAN) injection 2 mg   haloperidol lactate (HALDOL) injection 10 mg   And   diphenhydrAMINE (BENADRYL) injection 50 mg   And   LORazepam (ATIVAN) injection 2 mg   hydrOXYzine (ATARAX) tablet 25 mg   traZODone (DESYREL) tablet 50 mg   OLANZapine (ZYPREXA) tablet 5 mg      Medical Decision Making  Discussed admission to [facility] for continuous assessment and starting Zyprexa 5mg /day to address paranoia. Patient is in agreement with plan.   -Zyprexa 5mg  PO/day    Recommendations  Based on my evaluation the patient does not appear to have an emergency medical condition.  Doreatha Gamer, NP 04/16/24  5:17 AM

## 2024-04-16 NOTE — ED Notes (Signed)
 Patient Vol transferred to [facility] via safe transport. Pt A&Ox4, ambulatory w/ steady gait and VSS upon departure. All belongings and paperwork given to safe transport.

## 2024-04-16 NOTE — Group Note (Signed)
 LCSW Group Therapy Note  Group Date: 04/16/2024 Start Time: 1030 End Time: 1200   Type of Therapy and Topic: Soothing Skills   Participation Level:  Did Not Attend   Description of Group The focus of this group was to determine what unhealthy coping techniques typically are used by group members and what healthy coping techniques would be helpful in coping with various problems. Patients were guided in becoming aware of the differences between healthy and unhealthy coping techniques. Patients were asked to identify 2-3 healthy coping skills they would like to learn to use more effectively.  Therapeutic Goals Patients learned that coping is what human beings do all day long to deal with various situations in their lives Patients defined and discussed healthy vs unhealthy coping techniques Patients identified their preferred coping techniques and identified whether these were healthy or unhealthy Patients determined 2-3 healthy coping skills they would like to become more familiar with and use more often. Patients provided support and ideas to each other   Summary of Patient Progress:    Patient did not attend group.    Therapeutic Modalities Cognitive Behavioral Therapy DBT   Mariano Shiver, LCSWA 04/16/2024  1:07 PM

## 2024-04-16 NOTE — Progress Notes (Signed)
 [facility] LCSW Progress Note   04/16/2024    9:38 AM  Bolling Bushy   [id number]   Type of Contact and Topic:  Psychiatric Bed Placement   Pt accepted to [facility] [phone]    Patient meets inpatient criteria per Dan Dun, NP    The attending provider will be Dr. Thirza Fleet  Call report to [phone]    Nyle Belling, RN @ [facility] notified.     Pt scheduled  to arrive at [facility] TODAY.     Analei Whinery, MSW, LCSW-A  9:39 AM 04/16/2024

## 2024-04-16 NOTE — ED Notes (Signed)
 Pt sleeping at this time. Rise and fall of chest noted. Pt in NAD at this time. Will continue to monitor.

## 2024-04-16 NOTE — Plan of Care (Signed)

## 2024-04-16 NOTE — H&P (Cosign Needed Addendum)
 Psychiatric Admission Assessment Adult  Patient Identification: Mark Manning MRN:  [id number] Date of Evaluation:  04/16/2024 Chief Complaint:  Substance induced mood disorder (HCC) [F19.94] Principal Diagnosis: Substance induced mood disorder (HCC) Diagnosis:  Principal Problem:   Substance induced mood disorder (HCC)  CC: " I needed evaluation, because people are following me around due to Cyber attack and I lost my job during this process. "  History of Present Illness: This is the first inpatient psychiatric mental health admission for this 43 year old African-American male to [facility] for  treatment of paranoia due to polysubstance use disorder.  Patient presented to [facility] behavioral health urgent care for evaluation of his mental health in the context of feeling unsafe in his neighborhood, reporting, " I was hacked during cyber attack and lost my job behind it." After medical evaluation / stabilization & clearance, he was transferred to the [facility] for further psychiatric evaluation & treatments.  BAL less than 15, UDS positive for cocaine and marijuana.  As per chart review, Mark Manning was seen and treated at [facility], ED for crack cocaine abuse and marijuana abuse in 2019.  No indication of psychotropic medication noted in chart.  I go  During this assessment, Mark Manning reports he felt paranoid and believed that people are following him stating there was a Cyber attack and his phone was hacked.  Reports people followed him from his apartment, he had to go to his grandmother's house and knocked at her door.  He reports persistent discomfort due to the sensation of being monitored by the people and unable to live in his apartment due to the people watching his movement and whereabouts.  He expresses that these individuals are monitoring him.  Reports they have sophisticated equipment that will not make them to be caught if they wish to harm or  kill him.  Patient added, "these people lied on me and making false stories about me."  He reports difficulty sleeping, and as a result, he walks aimlessly to avoid detection and his feet are swollen due to walking.  He had to go to the hospital for checkup and he was given potassium.  He reports feeling frustrated because nobody believes what he is saying and thinking he is crazy.  Reports his mother, uncle, and aunt arrived at his grandmother's house and requested him to be taken to a mental health clinic for evaluation.  Mark Manning endorses being in jail due to possession of crack cocaine from April 11 to Apr 06, 2024.  Reports currently being on probation and have a court date on 04/18/2024 for eviction from his apartment.  He reports feeling of sadness, depressed mood, anhedonia, fatigue, difficulty concentrating, worthlessness, hopelessness, for the past 2 weeks since he came out of jail and being, 'followed by these people.'  He reports increasing anxiety and worrying too much.  He denies symptoms of mania, PTSD, or OCD.  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  Objective: Patient presents alert, cooperative, calm, and oriented to person, time, place, and partly situation.  Chart reviewed and findings shared with the treatment team and consult with attending psychiatrist, Dr. Zouev with recommendation to continue Zyprexa for psychosis, trazodone for sleep, and hydroxyzine for anxiety.  Speech is clear with normal volume and pattern, however, with some confusion.  Able to answer assessment questions appropriately.  Delusional preoccupation and paranoia noted during  this assessment.  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 It may sound creepy but I am not crazy."  He denies SI, HI, or AH.  Vital signs reviewed without critical values.  Admission labs and EKG reviewed as indicated  in the treatment plan.  Patient is admitted for safety, mood stabilization, and medication management.  Mode of transport to Hospital: Safe transport Current Outpatient (Home) Medication List: See medication listing PRN medication prior to evaluation: Seeing a medication listing  ED course: Labs and EKG will obtain and analyze Collateral Information: None obtained at this time POA/Legal Guardian: Patient is his own legal guardian  Past Psychiatric Hx: Previous Psych Diagnoses: Polysubstance use disorder Prior inpatient treatment: Denies Current/prior outpatient treatment: Denies, however, per chart review, patient has been seen at [facility] outpatient in 2019 Prior rehab hx: Denies Psychotherapy hx: Denies History of suicide: Denies History of homicide or aggression: Denies Psychiatric medication history: Denies Psychiatric medication compliance history: Denies being on psychotropic medications Neuromodulation history: Denies Current Psychiatrist: Denies Current therapist: The  Substance Abuse Hx: Alcohol: Reports drinking 1 to 2 cans of beer daily with last drink on Friday, 04/14/2024 Tobacco: Smokes 1 pack of cigarettes daily; smokes 1 g of marijuana daily with last use and Friday, 04/14/2024 Illicit drugs: Smokes crack cocaine 1 g daily, last use Apr 06, 2024 Rx drug abuse: Denies Rehab hx: Denies  Past Medical History: Medical Diagnoses: Hernia surgery in 2023 Home Rx: Denies Prior Hosp: In 2023 Prior Surgeries/Trauma: Hernia surgery 2023 Head trauma, LOC, concussions, seizures: Denies Allergies: Clindamycin/lincomycin Not Specified   Swelling   LMP: Not applicable Contraception: Not applicable PCP: Denies  Family History: Medical: Unsure Psych: Unsure Psych Rx: Unsure SA/HA: Unsure Substance use family hx: Unsure  Social History: Childhood (bring, raised, lives now, parents, siblings, schooling, education): 12th grade education Abuse: Denies Marital Status:  Divorced Sexual orientation: Male from birth Children: 3 children ages 67, 16, and 43 Employment: Unemployed Peer Group: Denies peer  Group Housing: Lives in an apartment Finances: Some financial difficulties Legal: Yes, currently on probation.  Has court on Tuesday, 04/17/2024 due to eviction case.   Military: Denied affiliation with the [employer]  Associated signs/Symptoms: Depresson Symptoms:  depressed mood, anhedonia, fatigue, feelings of worthlessness/guilt, difficulty concentrating, hopelessness, anxiety, (Hypo) Manic Symptoms:  Denies Anxiety Symptoms:  Excessive Worry, Psychotic Symptoms:  Delusions, Paranoia, PTSD Symptoms: NA Total Time spent with patient: 1.5 hours  Is the patient at risk to self? Yes.    Has the patient been a risk to self in the past 6 months? Yes.    Has the patient been a risk to self within the distant past? Yes.    Is the patient a risk to others? No.  Has the patient been a risk to others in the past 6 months? No.  Has the patient been a risk to others within the distant past? No.   [place] Scale:  Flowsheet Row [facility] (Current[facility] Most recent reading at 04/16/2024 11:45 AM ED from 04/16/2024 in [facility] Most recent reading at 04/16/2024  3:09 AM  C-SSRS RISK CATEGORY No Risk No Risk      Alcohol Screening: 1. How often do you have a drink containing alcohol?: Never 2. How many drinks containing alcohol do you have on a typical day when you are drinking?: 1 or 2 3. How often do you have six or more drinks  on one occasion?: Never AUDIT-C Score: 0 4. How often during the last year have you found that you were not able to stop drinking once you had started?: Never 5. How often during the last year have you failed to do what was normally expected from you because of drinking?: Never 6. How often during the last year have you needed a first drink in the  morning to get yourself going after a heavy drinking session?: Never 7. How often during the last year have you had a feeling of guilt of remorse after drinking?: Never 8. How often during the last year have you been unable to remember what happened the night before because you had been drinking?: Never 9. Have you or someone else been injured as a result of your drinking?: No 10. Has a relative or friend or a doctor or another health worker been concerned about your drinking or suggested you cut down?: No Alcohol Use Disorder Identification Test Final Score (AUDIT): 0 Substance Abuse History in the last 12 months:  Yes.   Consequences of Substance Abuse: Discussed with patient during this admission evaluation.   Medical Consequences: Liver damage, Possible death by overdose   Legal Consequences: Arrests, jail time, Loss of driving privilege.   Family Consequences: Family discord, divorce and or separation.  Previous Psychotropic Medications: No  Psychological Evaluations: Yes  Past Medical History: History reviewed. No pertinent past medical history. History reviewed. No pertinent surgical history. Family History: History reviewed. No pertinent family history. Tobacco Screening:  Social History   Tobacco Use  Smoking Status Every Day   Current packs/day: 1.00   Types: Cigarettes  Smokeless Tobacco Never    BH Tobacco Counseling     Are you interested in Tobacco Cessation Medications?  No value filed. Counseled patient on smoking cessation:  No value filed. Reason Tobacco Screening Not Completed: No value filed.    Social History:  Social History   Substance and Sexual Activity  Alcohol Use Yes   Comment: occasionally     Social History   Substance and Sexual Activity  Drug Use Yes   Types: Cocaine, Marijuana   Comment: last use 01/18/18    Additional Social History:    Allergies:   Allergies  Allergen Reactions   Clindamycin/Lincomycin     Swelling    Lab  Results:  Results for orders placed or performed during the hospital encounter of 04/16/24 (from the past 48 hours)  CBC with Differential/Platelet     Status: Abnormal   Collection Time: 04/16/24  2:23 AM  Result Value Ref Range   WBC 5.4 4.0 - 10.5 K/uL   RBC 4.16 (L) 4.22 - 5.81 MIL/uL   Hemoglobin 11.8 (L) 13.0 - 17.0 g/dL   HCT 16.1 (L) 09.6 - 04.5 %   MCV 91.1 80.0 - 100.0 fL   MCH 28.4 26.0 - 34.0 pg   MCHC 31.1 30.0 - 36.0 g/dL   RDW 40.9 81.1 - 91.4 %   Platelets 304 150 - 400 K/uL   nRBC 0.0 0.0 - 0.2 %   Neutrophils Relative % 59 %   Neutro Abs 3.2 1.7 - 7.7 K/uL   Lymphocytes Relative 28 %   Lymphs Abs 1.5 0.7 - 4.0 K/uL   Monocytes Relative 11 %   Monocytes Absolute 0.6 0.1 - 1.0 K/uL   Eosinophils Relative 2 %   Eosinophils Absolute 0.1 0.0 - 0.5 K/uL   Basophils Relative 0 %   Basophils Absolute 0.0 0.0 -  0.1 K/uL   Immature Granulocytes 0 %   Abs Immature Granulocytes 0.01 0.00 - 0.07 K/uL    Comment: Performed at [facility], [address], [place], [place] [address]  Comprehensive metabolic panel     Status: Abnormal   Collection Time: 04/16/24  2:23 AM  Result Value Ref Range   Sodium 142 135 - 145 mmol/L   Potassium 3.8 3.5 - 5.1 mmol/L   Chloride 105 98 - 111 mmol/L   CO2 29 22 - 32 mmol/L   Glucose, Bld 105 (H) 70 - 99 mg/dL    Comment: Glucose reference range applies only to samples taken after fasting for at least 8 hours.   BUN 10 6 - 20 mg/dL   Creatinine, Ser 8.29 0.61 - 1.24 mg/dL   Calcium 9.1 8.9 - 56.2 mg/dL   Total Protein 6.6 6.5 - 8.1 g/dL   Albumin 3.4 (L) 3.5 - 5.0 g/dL   AST 25 15 - 41 U/L   ALT 20 0 - 44 U/L   Alkaline Phosphatase 43 38 - 126 U/L   Total Bilirubin 0.4 0.0 - 1.2 mg/dL   GFR, Estimated >13 >08 mL/min    Comment: (NOTE) Calculated using the CKD-EPI Creatinine Equation (2021)    Anion gap 8 5 - 15    Comment: Performed at [facility], [address] [place], [place] [address]  Ethanol     Status:  None   Collection Time: 04/16/24  2:23 AM  Result Value Ref Range   Alcohol, Ethyl (B) <15 <15 mg/dL    Comment: (NOTE) For medical purposes only. Performed at [facility], [address], [place], [place] [address]   Lipid panel     Status: None   Collection Time: 04/16/24  2:23 AM  Result Value Ref Range   Cholesterol 126 0 - 200 mg/dL   Triglycerides 88 <528 mg/dL   HDL 46 >41 mg/dL   Total CHOL/HDL Ratio 2.7 RATIO   VLDL 18 0 - 40 mg/dL   LDL Cholesterol 62 0 - 99 mg/dL    Comment:        Total Cholesterol/HDL:CHD Risk Coronary Heart Disease Risk Table                     Men   Women  1/2 Average Risk   3.4   3.3  Average Risk       5.0   4.4  2 X Average Risk   9.6   7.1  3 X Average Risk  23.4   11.0        Use the calculated Patient Ratio above and the CHD Risk Table to determine the patient's CHD Risk.        ATP III CLASSIFICATION (LDL):  <100     mg/dL   Optimal  324-401  mg/dL   Near or Above                    Optimal  130-159  mg/dL   Borderline  027-253  mg/dL   High  >664     mg/dL   Very High Performed at [facility], [address] [place], [place] [address]   TSH     Status: None   Collection Time: 04/16/24  2:23 AM  Result Value Ref Range   TSH 0.501 0.350 - 4.500 uIU/mL    Comment: Performed by a 3rd Generation assay with  a functional sensitivity of <=0.01 uIU/mL. Performed at [facility], [address], [place], [place] [address]   POCT Urine Drug Screen - (I-Screen)     Status: Abnormal   Collection Time: 04/16/24  2:26 AM  Result Value Ref Range   POC Amphetamine UR None Detected NONE DETECTED (Cut Off Level 1000 ng/mL)   POC Secobarbital (BAR) None Detected NONE DETECTED (Cut Off Level 300 ng/mL)   POC Buprenorphine (BUP) None Detected NONE DETECTED (Cut Off Level 10 ng/mL)   POC Oxazepam (BZO) None Detected NONE DETECTED (Cut Off Level 300 ng/mL)   POC Cocaine UR Positive (A) NONE DETECTED (Cut Off Level 300 ng/mL)    POC Methamphetamine UR None Detected NONE DETECTED (Cut Off Level 1000 ng/mL)   POC Morphine None Detected NONE DETECTED (Cut Off Level 300 ng/mL)   POC Methadone UR None Detected NONE DETECTED (Cut Off Level 300 ng/mL)   POC Oxycodone UR None Detected NONE DETECTED (Cut Off Level 100 ng/mL)   POC Marijuana UR Positive (A) NONE DETECTED (Cut Off Level 50 ng/mL)   Blood Alcohol level:  Lab Results  Component Value Date   [facility] <15 04/16/2024   ETH <10 01/19/2018   Metabolic Disorder Labs:  No results found for: "HGBA1C", "MPG" No results found for: "PROLACTIN" Lab Results  Component Value Date   CHOL 126 04/16/2024   TRIG 88 04/16/2024   HDL 46 04/16/2024   CHOLHDL 2.7 04/16/2024   VLDL 18 04/16/2024   LDLCALC 62 04/16/2024   Current Medications: Current Facility-Administered Medications  Medication Dose Route Frequency Provider Last Rate Last Admin   acetaminophen (TYLENOL) tablet 650 mg  650 mg Oral Q6H PRN Levester Reagin, NP       LORazepam (ATIVAN) injection 2 mg  2 mg Intramuscular TID PRN Lewis, Verner Goltz, NP       OLANZapine (ZYPREXA) injection 5 mg  5 mg Intramuscular TID PRN Lewis, Verner Goltz, NP       OLANZapine (ZYPREXA) tablet 5 mg  5 mg Oral QHS Lewis, Tanika N, NP       OLANZapine (ZYPREXA) tablet 5 mg  5 mg Oral QHS Lewis, Verner Goltz, NP       OLANZapine zydis (ZYPREXA) disintegrating tablet 5 mg  5 mg Oral TID PRN Lewis, Tanika N, NP       PTA Medications: Medications Prior to Admission  Medication Sig Dispense Refill Last Dose/Taking   doxycycline (VIBRAMYCIN) 100 MG capsule Take 100 mg by mouth 2 (two) times daily.   Taking   furosemide (LASIX) 20 MG tablet Take 20 mg by mouth daily. X 3 days   Taking   potassium chloride SA (KLOR-CON M) 20 MEQ tablet Take 40 mEq by mouth. X 5 days   Taking   Musculoskeletal: Strength & Muscle Tone: within normal limits Gait & Station: normal Patient leans: N/A  Psychiatric Specialty Exam:  Presentation  General  Appearance:  Casual  Eye Contact: Good  Speech: Clear and Coherent  Speech Volume: Normal  Handedness: Right  Mood and Affect  Mood: Anxious  Affect: Congruent  Thought Process  Thought Processes: Linear  Duration of Psychotic Symptoms:Greater than 6 months Past Diagnosis of Schizophrenia or Psychoactive disorder: No  Descriptions of Associations:Loose  Orientation:Full (Time, Place and Person)  Thought Content:Paranoid Ideation  Hallucinations:Hallucinations: None  Ideas of Reference:Paranoia  Suicidal Thoughts:Suicidal Thoughts: No  Homicidal Thoughts:Homicidal Thoughts: No  Sensorium  Memory: Immediate Good; Recent Fair; Remote Fair  Judgment: Impaired  Insight: Lacking  Executive Functions  Concentration: Good  Attention Span: Good  Recall: Fair  Fund of Knowledge: Good  Language: Good  Psychomotor Activity  Psychomotor Activity: Psychomotor Activity: Normal  Assets  Assets: Communication Skills; Desire for Improvement; Social Support  Sleep  Sleep: Sleep: Good Number of Hours of Sleep: 5  Physical Exam: Physical Exam Vitals and nursing note reviewed.  Constitutional:      Appearance: He is normal weight.  HENT:     Head: Normocephalic.     Right Ear: External ear normal.     Left Ear: External ear normal.     Nose: Nose normal.     Mouth/Throat:     Mouth: Mucous membranes are moist.     Pharynx: Oropharynx is clear.  Eyes:     Extraocular Movements: Extraocular movements intact.  Cardiovascular:     Rate and Rhythm: Normal rate.     Pulses: Normal pulses.  Pulmonary:     Effort: Pulmonary effort is normal.  Abdominal:     Comments: Deferred  Genitourinary:    Comments: Deferred Musculoskeletal:        General: Normal range of motion.     Cervical back: Normal range of motion.  Skin:    General: Skin is warm.  Neurological:     General: No focal deficit present.     Mental Status: He is alert and  oriented to person, place, and time.  Psychiatric:        Mood and Affect: Mood normal.        Behavior: Behavior normal.    Review of Systems  Constitutional:  Negative for chills and fever.  HENT:  Negative for sore throat.   Eyes:  Negative for blurred vision.  Respiratory:  Negative for cough, sputum production, shortness of breath and wheezing.   Cardiovascular:  Negative for chest pain and palpitations.  Gastrointestinal:  Negative for heartburn, nausea and vomiting.  Genitourinary:  Negative for dysuria and urgency.  Musculoskeletal:  Negative for falls.  Skin:  Negative for itching and rash.  Neurological:  Negative for dizziness, tingling, tremors and headaches.  Endo/Heme/Allergies:        See allergy listing  Psychiatric/Behavioral:  Positive for depression and substance abuse. Negative for hallucinations and suicidal ideas. The patient is nervous/anxious. The patient does not have insomnia.    Blood pressure 101/68, pulse 72, temperature 97.8 F (36.6 C), temperature source Oral, resp. rate 18, height 5\' 10"  (1.778 m), weight 88.5 kg, SpO2 100%. Body mass index is 27.98 kg/m.  Treatment Plan Summary: Daily contact with patient to assess and evaluate symptoms and progress in treatment and Medication management  Physician Treatment Plan for Primary Diagnosis: Substance induced mood disorder (HCC) Long Term Goal(s): Improvement in symptoms so as ready for discharge  Short Term Goals: Ability to identify changes in lifestyle to reduce recurrence of condition will improve, Ability to verbalize feelings will improve, Ability to disclose and discuss suicidal ideas, Ability to demonstrate self-control will improve, Ability to identify and develop effective coping behaviors will improve, Ability to maintain clinical measurements within normal limits will improve, Compliance with prescribed medications will improve, and Ability to identify triggers associated with substance  abuse/mental health issues will improve  Physician Treatment Plan for Secondary Diagnosis: Assessment:  This is the first inpatient psychiatric mental health admission for this 43 year old African-American male to [facility] for  treatment of paranoia due to polysubstance use disorder.  Patient presented to [facility]  behavioral health urgent care for evaluation of his mental health in the context of feeling unsafe in his neighborhood, reporting, " I was hacked during cyber attack and lost my job behind it."    Principal Problem:   Substance induced mood disorder (HCC)  Plan: Medication: --Initiate Prozac 10 mg p.o. daily for depression and anxiety. --Continue Olanzapine tablet 5 mg p.o. daily at bedtime for psychosis --Trazodone tablet 50 mg p.o. as needed at bedtime for insomnia --Hydroxyzine tablet 25 mg p.o. 3 times daily as needed for anxiety  Other PRN Medications  -Acetaminophen 650 mg every 6 as needed/mild pain  -Maalox 30 mL oral every 4 as needed/digestion  -Magnesium hydroxide 30 mL daily as needed/mild constipation   --The risks/benefits/side-effects/alternatives to this medication were discussed in detail with the patient and time was given for questions. The patient consents to medication trial.   -- Metabolic profile and EKG monitoring obtained while on an atypical antipsychotic (BMI: Lipid Panel: HbgA1c: QTc:)   -- Encouraged patient to participate in unit milieu and in scheduled group therapies   Continue BH Agitation Protocol  --Haldol 5 mg, oral, 3 times daily as needed, mild agitation  --Benadryl 50 mg, oral, 3 times daily as needed, mild agitation                                      OR   --Haldol injection 5 mg, IM, 3 times daily as needed, moderate agitation  --Benadryl injection 50 mg, IM, 3 times daily as needed, moderate agitation  --Ativan injection 2 mg, IM, 3 times daily as needed, moderate agitation                                       OR  --Haldol injection 10 mg, IM, 3 times daily as needed, severe agitation  --Benadryl injection 50 mg, IM, 3 times daily as needed, severe agitation  --Ativan injection 2 mg, IM, 3 times daily as needed, severe agitation   Admission labs reviewed: CMP: Glucose 105 elevated, albumin 3.4 low, otherwise normal.  Lipid profile: Within normal limits.  CBC with differential: RBC 4.16 low, hemoglobin 11.8 low, HCT 37.9 low, otherwise normal.  TSH: 0.501 normal.  BAL: Less than 15 normal.  UDS: Positive for cocaine, positive for marijuana.  New labs ordered: Hemoglobin A1c, vitamin D 25-hydroxy  EKG reviewed: Normal sinus rhythm, ventricular rate 79, QT/QTc 358/410.  Safety and Monitoring:  Voluntary admission to inpatient psychiatric unit for safety, stabilization and treatment  Daily contact with patient to assess and evaluate symptoms and progress in treatment  Patient's case to be discussed in multi-disciplinary team meeting  Observation Level : q15 minute checks  Vital signs: q12 hours  Precautions: suicide, but pt currently verbally contracts for safety on unit?   Discharge Planning:  Social work and case management to assist with discharge planning and identification of hospital follow-up needs prior to discharge  Estimated LOS: 5-7?days  Discharge Concerns: Need to establish a safety plan; Medication compliance and effectiveness  Discharge Goals: Return home with outpatient referrals for mental health follow-up including medication management/psychotherapy.   I certify that inpatient services furnished can reasonably be expected to improve the patient's condition.    Lakeena Downie C Arlie Riker, FNP 6/8/20253:17 PM
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
## 2024-04-16 NOTE — ED Provider Notes (Signed)
 [facility]BC/OBS ASAP Discharge Summary  Date and Time: 04/16/2024 9:53 AM  Name: Mark Manning  MRN:  [id number]   Discharge Diagnoses:  Final diagnoses:  Cocaine abuse (HCC)  Cannabis abuse  Acute paranoia (HCC)  Substance induced mood disorder (HCC)    Subjective:   Stay Summary:   Total Time spent with patient: 15 minutes  Past Psychiatric History: *** Past Medical History: *** Family History: *** Family Psychiatric History: *** Social History: *** Tobacco Cessation:  {Discharge tobacco cessation prescription:[id number]}  Current Medications:  Current Facility-Administered Medications  Medication Dose Route Frequency Provider Last Rate Last Admin  . alum & mag hydroxide-simeth (MAALOX/MYLANTA) 200-200-20 MG/5ML suspension 30 mL  30 mL Oral Q4H PRN Ajibola, Ene A, NP      . haloperidol (HALDOL) tablet 5 mg  5 mg Oral TID PRN Ajibola, Ene A, NP       And  . diphenhydrAMINE (BENADRYL) capsule 50 mg  50 mg Oral TID PRN Ajibola, Ene A, NP      . diphenhydrAMINE (BENADRYL) injection 50 mg  50 mg Intramuscular TID PRN Ajibola, Ene A, NP      . diphenhydrAMINE (BENADRYL) injection 50 mg  50 mg Intramuscular TID PRN Ajibola, Ene A, NP      . hydrOXYzine (ATARAX) tablet 25 mg  25 mg Oral TID PRN Ajibola, Ene A, NP   25 mg at 04/16/24 0246  . magnesium hydroxide (MILK OF MAGNESIA) suspension 30 mL  30 mL Oral Daily PRN Ajibola, Ene A, NP      . traZODone (DESYREL) tablet 50 mg  50 mg Oral QHS PRN Ajibola, Ene A, NP   50 mg at 04/16/24 0246   No current outpatient medications on file.    PTA Medications:  Facility Ordered Medications  Medication  . alum & mag hydroxide-simeth (MAALOX/MYLANTA) 200-200-20 MG/5ML suspension 30 mL  . magnesium hydroxide (MILK OF MAGNESIA) suspension 30 mL  . haloperidol (HALDOL) tablet 5 mg   And  . diphenhydrAMINE (BENADRYL) capsule 50 mg  . diphenhydrAMINE (BENADRYL) injection 50 mg  . diphenhydrAMINE (BENADRYL) injection 50 mg  . hydrOXYzine  (ATARAX) tablet 25 mg  . traZODone (DESYREL) tablet 50 mg        No data to display          Flowsheet Row ED from [facility]/16/2024 in [facility]  C-SSRS RISK CATEGORY No Risk       Musculoskeletal  Strength & Muscle Tone: within normal limits Gait & Station: normal Patient leans: N/A  Psychiatric Specialty Exam  Presentation  General Appearance:  Casual  Eye Contact: Good  Speech: Clear and Coherent  Speech Volume: Normal  Handedness: Right   Mood and Affect  Mood: Anxious  Affect: Congruent   Thought Process  Thought Processes: Linear  Descriptions of Associations:Loose  Orientation:Full (Time, Place and Person)  Thought Content:Paranoid Ideation  Diagnosis of Schizophrenia or Schizoaffective disorder in past: No    Hallucinations:Hallucinations: None  Ideas of Reference:Paranoia  Suicidal Thoughts:Suicidal Thoughts: No  Homicidal Thoughts:Homicidal Thoughts: No   Sensorium  Memory: Immediate Good; Recent Fair; Remote Fair  Judgment: Impaired  Insight: Lacking   Executive Functions  Concentration: Good  Attention Span: Good  Recall: Fair  Fund of Knowledge: Good  Language: Good   Psychomotor Activity  Psychomotor Activity: Psychomotor Activity: Normal   Assets  Assets: Communication Skills; Desire for Improvement; Social Support   Sleep  Sleep: Sleep: Poor Number of Hours of Sleep: 0  Nutritional Assessment (For OBS and FBC admissions only) Has the patient had a weight loss or gain of 10 pounds or more in the last 3 months?: No Has the patient had a decrease in food intake/or appetite?: No Does the patient have dental problems?: No Does the patient have eating habits or behaviors that may be indicators of an eating disorder including binging or inducing vomiting?: No Has the patient recently lost weight without trying?: 0 Has the patient been eating poorly because of a  decreased appetite?: 0 Malnutrition Screening Tool Score: 0    Physical Exam  Physical Exam ROS Blood pressure 123/86, pulse 79, temperature 97.6 F (36.4 C), temperature source Oral, resp. rate 18, SpO2 100%. There is no height or weight on file to calculate BMI.  Demographic Factors:  Male and Unemployed  Loss Factors: NA  Historical Factors: Family history of mental illness or substance abuse  Risk Reduction Factors:   Responsible for children under 51 years of age, Positive social support, and Positive therapeutic relationship  Continued Clinical Symptoms:  Alcohol/Substance Abuse/Dependencies  Cognitive Features That Contribute To Risk:  Closed-mindedness    Suicide Risk:  Minimal: No identifiable suicidal ideation.  Patients presenting with no risk factors but with morbid ruminations; may be classified as minimal risk based on the severity of the depressive symptoms  Plan Of Care/Follow-up recommendations:  Activity:  as tolerated  Diet:  Heart healthy  Disposition: Patient accepted to [facility]  Levester Reagin, NP 04/16/2024, 9:53 AM

## 2024-04-16 NOTE — ED Notes (Addendum)
 Pt A&O x 4. Pt denies SI/HI/AVH at the time of admission. Pt stated he is anxious and depressed. He also stated that he struggles with substance abuse and said he used marijuana and cocaine about a week ago. Pt oriented to the unit. Pt offered meal and beverage was given. Pt c/o bilateral ankle pain rated 8/10. Pt also has bilateral edema in his feet. Left  foot is +1 pitting. Pt given PRNs as per NP order. Facility protocol safety checks in place. Pt encouraged to notify staff if thoughts of hurting themselves or others arise. Pt verbalized understanding and agreement. Pt is currently safe on the unit.

## 2024-04-16 NOTE — ED Notes (Signed)
 Pt A&Ox4, calm & cooperative and in NAD at this time. Denies SI/HI/AVH. Contracts for safety. Encouragement and support given. Will continue to monitor.

## 2024-04-17 DIAGNOSIS — F1994 Other psychoactive substance use, unspecified with psychoactive substance-induced mood disorder: Secondary | ICD-10-CM | POA: Diagnosis not present

## 2024-04-17 LAB — HEMOGLOBIN A1C
Hgb A1c MFr Bld: 6.2 % — ABNORMAL HIGH (ref 4.8–5.6)
Mean Plasma Glucose: 131 mg/dL

## 2024-04-17 MED ORDER — FLUOXETINE HCL 20 MG PO CAPS
20.0000 mg | ORAL_CAPSULE | Freq: Every day | ORAL | Status: DC
  Administered 2024-04-18 – 2024-04-21 (×4): 20 mg via ORAL
  Filled 2024-04-17: qty 7
  Filled 2024-04-17: qty 2
  Filled 2024-04-17 (×3): qty 1

## 2024-04-17 MED ORDER — OLANZAPINE 7.5 MG PO TABS
7.5000 mg | ORAL_TABLET | Freq: Every day | ORAL | Status: DC
  Administered 2024-04-17: 7.5 mg via ORAL
  Filled 2024-04-17: qty 1

## 2024-04-17 NOTE — Progress Notes (Signed)
 [facility] MD Progress Note  04/17/2024 5:22 PM Mark Manning  MRN:  [id number] Subjective:  Mark Manning states, "I still believe those people are following me but not here in the hospital when I get out of here." Principal Problem: Substance induced mood disorder (HCC) Diagnosis: Principal Problem:   Substance induced mood disorder (HCC)  Reason for admission:   This is the first inpatient psychiatric mental health admission for this 43 year old African-American male to [facility] for  treatment of paranoia due to polysubstance use disorder.  Patient presented to [facility] behavioral health urgent care for evaluation of his mental health in the context of feeling unsafe in his neighborhood, reporting, " I was hacked during cyber attack and lost my job behind it." After medical evaluation / stabilization & clearance, he was transferred to the [facility] for further psychiatric evaluation & treatments.  BAL less than 15, UDS positive for cocaine and marijuana.   24-hour chart review: Vital signs reviewed without critical values.  PRNs of Tylenol for mild pain, and trazodone for sleep required.  No agitation protocol required.  Patient Case discussed with interdisciplinary team.  Today's assessment notes: On assessment today, the pt reports that his mood is less depressed.  Rates depression as #2/10, with 10 being high severity.  Chart reviewed findings shared with the treatment team and consult with attending psychiatrist.  Prozac increased from 10 mg p.o. daily to 20 mg p.o. daily for depression and anxiety.  Attention to hygiene much improved.  Speech is clear, with normal volume and pattern, however patient continues to be preoccupied with delusions and paranoia.  He denies SI, HI or AH.  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  Last physical visual hallucination on Friday, 04/14/2024.  Zyprexa increased from 5 mg to 7.5 mg  p.o. daily at bedtime.  Tolerating psychotropic medication well without side effect.  We will continue to monitor for safety Reports that anxiety is at manageable level Sleep is improving Appetite is good Concentration is better Energy level is adequate Denies suicidal thoughts.  Further denies suicidal intent and plan.  Denies having any HI.  Denies having psychotic symptoms.   Denies having side effects to current psychiatric medications.   We discussed changes to current medication regimen, including amending Prozac from 10 mg to 20 mg p.o. daily for anxiety and depression.  Further increasing Zyprexa from 5 mg to 7.5 mg p.o. daily for psychosis.  Patient is in agreement with medication adjustment.  Total Time spent with patient: 45 minutes  Past Psychiatric History: Prior inpatient treatment: Denies Current/prior outpatient treatment: Denies, however, per chart review, patient has been seen at [facility] outpatient in 2019 Prior rehab hx: Denies Psychotherapy hx: Denies History of suicide: Denies History of homicide or aggression: Denies Psychiatric medication history: Denies Psychiatric medication compliance history: Denies being on psychotropic medications Neuromodulation history: Denies Current Psychiatrist: Denies Current therapist: Denies   Past Medical History: History reviewed. No pertinent past medical history. History reviewed. No pertinent surgical history. Family History: History reviewed. No pertinent family history. Family Psychiatric  History: See H&P Social History:  Social History   Substance and Sexual Activity  Alcohol Use Yes   Comment: occasionally     Social History   Substance and Sexual Activity  Drug Use Yes   Types: Cocaine, Marijuana   Comment: last use 01/18/18    Social History   Socioeconomic History   Marital status:  Married    Spouse name: Not on file   Number of children: Not on file   Years of education: Not on file   Highest  education level: Not on file  Occupational History   Not on file  Tobacco Use   Smoking status: Every Day    Current packs/day: 1.00    Types: Cigarettes   Smokeless tobacco: Never  Vaping Use   Vaping status: Former  Substance and Sexual Activity   Alcohol use: Yes    Comment: occasionally   Drug use: Yes    Types: Cocaine, Marijuana    Comment: last use 01/18/18   Sexual activity: Not on file  Other Topics Concern   Not on file  Social History Narrative   Not on file   Social Drivers of Health   Financial Resource Strain: Not on file  Food Insecurity: Food Insecurity Present (04/16/2024)   Hunger Vital Sign    Worried About Running Out of Food in the Last Year: Often true    Ran Out of Food in the Last Year: Often true  Transportation Needs: Unmet Transportation Needs (04/16/2024)   PRAPARE - Administrator, Civil Service (Medical): Yes    Lack of Transportation (Non-Medical): Yes  Physical Activity: Not on file  Stress: Not on file  Social Connections: Not on file   Additional Social History:    Sleep: Good  Appetite:  Good  Current Medications: Current Facility-Administered Medications  Medication Dose Route Frequency Provider Last Rate Last Admin   acetaminophen (TYLENOL) tablet 650 mg  650 mg Oral Q6H PRN Levester Reagin, NP   650 mg at 04/16/24 1720   FLUoxetine (PROZAC) capsule 10 mg  10 mg Oral Daily Yarethzy Croak C, FNP   10 mg at 04/17/24 0840   hydrOXYzine (ATARAX) tablet 25 mg  25 mg Oral TID PRN Avik Leoni C, FNP       LORazepam (ATIVAN) injection 2 mg  2 mg Intramuscular TID PRN Lewis, Tanika N, NP       OLANZapine (ZYPREXA) injection 5 mg  5 mg Intramuscular TID PRN Levester Reagin, NP       OLANZapine (ZYPREXA) tablet 5 mg  5 mg Oral QHS Lewis, Tanika N, NP   5 mg at 04/16/24 2153   OLANZapine zydis (ZYPREXA) disintegrating tablet 5 mg  5 mg Oral TID PRN Lewis, Tanika N, NP       traZODone (DESYREL) tablet 50 mg  50 mg Oral QHS PRN Carren Blakley,  Nazifa Trinka C, FNP   50 mg at 04/16/24 2153   Lab Results:  Results for orders placed or performed during the hospital encounter of 04/16/24 (from the past 48 hours)  VITAMIN D 25 Hydroxy (Vit-D Deficiency, Fractures)     Status: Abnormal   Collection Time: 04/16/24  6:49 PM  Result Value Ref Range   Vit D, 25-Hydroxy 17.61 (L) 30 - 100 ng/mL    Comment: (NOTE) Vitamin D deficiency has been defined by the Institute of Medicine  and an Endocrine Society practice guideline as a level of serum 25-OH  vitamin D less than 20 ng/mL (1,2). The Endocrine Society went on to  further define vitamin D insufficiency as a level between 21 and 29  ng/mL (2).  1. IOM (Institute of Medicine). 2010. Dietary reference intakes for  calcium and D. [place]: [employer]. 2. Holick MF, Binkley Melvin, Bischoff-Ferrari HA, et al. Evaluation,  treatment, and prevention of vitamin D  deficiency: an Endocrine  Society clinical practice guideline, JCEM. 2011 Jul; 96(7): 1911-30.  Performed at [facility], [address] [place], [place] [address]    Blood Alcohol level:  Lab Results  Component Value Date   [facility] <15 04/16/2024   ETH <10 01/19/2018    Metabolic Disorder Labs: Lab Results  Component Value Date   HGBA1C 6.2 (H) 04/16/2024   MPG 131 04/16/2024   No results found for: "PROLACTIN" Lab Results  Component Value Date   CHOL 126 04/16/2024   TRIG 88 04/16/2024   HDL 46 04/16/2024   CHOLHDL 2.7 04/16/2024   VLDL 18 04/16/2024   LDLCALC 62 04/16/2024    Physical Findings: AIMS:  , ,  ,  ,    CIWA:    COWS:     Musculoskeletal: Strength & Muscle Tone: within normal limits Gait & Station: normal Patient leans: N/A  Psychiatric Specialty Exam:  Presentation  General Appearance:  Appropriate for Environment; Casual  Eye Contact: Good  Speech: Clear and Coherent  Speech Volume: Normal  Handedness: Right  Mood and Affect  Mood: Anxious;  Depressed  Affect: Congruent  Thought Process  Thought Processes: Coherent; Linear  Descriptions of Associations:Loose  Orientation:Full (Time, Place and Person)  Thought Content:Paranoid Ideation  History of Schizophrenia/Schizoaffective disorder:No  Duration of Psychotic Symptoms:Greater than six months  Hallucinations:Hallucinations: Visual Description of Visual Hallucinations: 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, 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    Review of Systems  Constitutional:  Negative for chills and fever.  HENT:  Negative for sore throat.   Eyes:  Negative for blurred vision and double vision.  Respiratory:  Negative for cough, sputum production, shortness of breath and wheezing.   Cardiovascular:  Negative for chest pain and palpitations.  Gastrointestinal:  Negative for abdominal pain, heartburn, nausea and vomiting.  Genitourinary:  Negative for dysuria and urgency.  Musculoskeletal:  Negative for falls.  Skin:  Negative for itching and rash.  Neurological:  Negative for dizziness and headaches.  Endo/Heme/Allergies:        See allergy listing  Psychiatric/Behavioral:  Positive for depression. Negative for hallucinations, substance abuse and suicidal ideas. The patient is nervous/anxious. The patient does not have insomnia.    Blood pressure  114/78, pulse 77, temperature 98.3 F (36.8 C), temperature source Oral, resp. rate 18, height 5\' 10"  (1.778 m), weight 88.5 kg, SpO2 99%. Body mass index is 27.98 kg/m.   Treatment Plan Summary: Daily contact with patient to assess and evaluate symptoms and progress in treatment and Medication management Physician Treatment Plan for Primary Diagnosis: Substance induced mood disorder (HCC) Long Term Goal(s): Improvement in symptoms so as ready for discharge   Short Term Goals: Ability to identify changes in lifestyle to reduce recurrence of condition will improve, Ability to verbalize feelings will improve, Ability to disclose and discuss suicidal ideas, Ability to demonstrate self-control will improve, Ability to identify and develop effective coping behaviors will improve,  Ability to maintain clinical measurements within normal limits will improve, Compliance with prescribed medications will improve, and Ability to identify triggers associated with substance abuse/mental health issues will improve   Physician Treatment Plan for Secondary Diagnosis: Assessment:  This is the first inpatient psychiatric mental health admission for this 43 year old African-American male to [facility] for  treatment of paranoia due to polysubstance use disorder.  Patient presented to [facility] behavioral health urgent care for evaluation of his mental health in the context of feeling unsafe in his neighborhood, reporting, " I was hacked during cyber attack and lost my job behind it."     Principal Problem:   Substance induced mood disorder (HCC)   Plan: Medication: -- Increase Prozac from 10 mg to 20 mg p.o. daily for depression and anxiety starting tomorrow. -- Increase Olanzapine tablet from 5 mg to 7.5 mg p.o. daily at bedtime for psychosis --Trazodone tablet 50 mg p.o. as needed at bedtime for insomnia --Hydroxyzine tablet 25 mg p.o. 3 times daily as needed for anxiety    Other PRN Medications  -Acetaminophen 650 mg every 6 as needed/mild pain  -Maalox 30 mL oral every 4 as needed/digestion  -Magnesium hydroxide 30 mL daily as needed/mild constipation    --The risks/benefits/side-effects/alternatives to this medication were discussed in detail with the patient and time was given for questions. The patient consents to medication trial.   -- Metabolic profile and EKG monitoring obtained while on an atypical antipsychotic (BMI: Lipid Panel: HbgA1c: QTc:)   -- Encouraged patient to participate in unit milieu and in scheduled group therapies    Continue BH Agitation Protocol  --Haldol 5 mg, oral, 3 times daily as needed, mild agitation  --Benadryl 50 mg, oral, 3 times daily as needed, mild agitation                                      OR   --Haldol injection 5 mg, IM, 3 times daily as needed, moderate agitation  --Benadryl injection 50 mg, IM, 3 times daily as needed, moderate agitation  --Ativan injection 2 mg, IM, 3 times daily as needed, moderate agitation                                      OR  --Haldol injection 10 mg, IM, 3 times daily as needed, severe agitation  --Benadryl injection 50 mg, IM, 3 times daily as needed, severe agitation  --Ativan injection 2 mg, IM, 3 times daily as needed, severe agitation    Admission labs reviewed: CMP: Glucose 105 elevated, albumin 3.4 low, otherwise normal.  Lipid profile: Within normal limits.  CBC with differential: RBC 4.16 low, hemoglobin 11.8 low, HCT 37.9 low, otherwise normal.  TSH: 0.501 normal.  BAL: Less than 15 normal.  UDS: Positive for cocaine, positive for marijuana.   New labs ordered: Hemoglobin A1c, vitamin D 25-hydroxy   EKG reviewed: Normal sinus rhythm, ventricular rate 79, QT/QTc 358/410.   Safety and Monitoring:  Voluntary admission to inpatient psychiatric unit for safety, stabilization and treatment  Daily contact with patient to assess and evaluate symptoms and progress in treatment   Patient's case to be discussed in multi-disciplinary team meeting  Observation Level : q15 minute checks  Vital signs: q12 hours  Precautions: suicide, but pt currently verbally contracts for  safety on unit?    Discharge Planning:  Social work and case management to assist with discharge planning and identification of hospital follow-up needs prior to discharge  Estimated LOS: 5-7?days  Discharge Concerns: Need to establish a safety plan; Medication compliance and effectiveness  Discharge Goals: Return home with outpatient referrals for mental health follow-up including medication management/psychotherapy.    I certify that inpatient services furnished can reasonably be expected to improve the patient's condition.   Laurence Pons, FNP 04/17/2024, 5:22 PM

## 2024-04-17 NOTE — BHH Counselor (Signed)
 Adult Comprehensive Assessment  Patient ID: Mark Manning, male   DOB: [birth year], 43 y.o.   MRN: [id number]  FIRST ATTEMPT:  11:45 AM - patient was waiting for the Treatment Team meeting, then he would go to lunch.   2:45 PM - patient was asleep.  CSW will attempt to complete the assessment tomorrow, Tuesday, 04/18/2024.   Mark Manning O Mark Manning, LCSWA  04/17/2024

## 2024-04-17 NOTE — Group Note (Signed)
 Date:  04/17/2024 Time:  9:08 AM  Group Topic/Focus:  Goals Group:   The focus of this group is to help patients establish daily goals to achieve during treatment and discuss how the patient can incorporate goal setting into their daily lives to aide in recovery.    Participation Level:  Did Not Attend   Ellan Gunner 04/17/2024, 9:08 AM

## 2024-04-17 NOTE — Plan of Care (Signed)
  Problem: Education: Goal: Knowledge of [facility] Education information/materials will improve Outcome: Progressing   Problem: Physical Regulation: Goal: Ability to maintain clinical measurements within normal limits will improve Outcome: Progressing

## 2024-04-17 NOTE — Progress Notes (Signed)
   04/16/24 2045  Psych Admission Type (Psych Patients Only)  Admission Status Voluntary  Psychosocial Assessment  Patient Complaints None  Eye Contact Fair  Facial Expression Flat  Affect Appropriate to circumstance  Speech Logical/coherent  Interaction  (Appripriate)  Motor Activity Other (Comment) (WNL)  Appearance/Hygiene  (Appropriate)  Behavior Characteristics Appropriate to situation;Cooperative;Calm  Mood Pleasant  Thought Process  Coherency WDL  Content WDL  Delusions None reported or observed  Perception WDL  Hallucination None reported or observed  Judgment Limited  Danger to Self  Current suicidal ideation?  (Denies)  Agreement Not to Harm Self Yes  Description of Agreement Notify Staff  Danger to Others  Danger to Others None reported or observed   Pt A/O x4 with noted Paranoia. Pt denied SI/HI; A/V/H while at the facility. Pt did confirm paranoia at home. Pt did also express Court on Tues 04/18/24 R/T to rent dispute with landlord. Pt was advised to speak to Child psychotherapist. Pt has non-pitting edema to bilat. ankles and feet. Pt advised to elevate feet while lying. Pt acknowledge with understanding. No noted distress. Continuing care during 7p-7a shift. No noted distress.

## 2024-04-17 NOTE — Progress Notes (Signed)
   04/17/24 0900  Psych Admission Type (Psych Patients Only)  Admission Status Voluntary  Psychosocial Assessment  Patient Complaints None  Eye Contact Fair  Facial Expression Other (Comment) (neutral)  Affect Appropriate to circumstance  Speech Logical/coherent  Interaction Other (Comment) (WNL)  Motor Activity Other (Comment) (WNL)  Appearance/Hygiene Unremarkable  Behavior Characteristics Appropriate to situation  Mood Pleasant  Thought Process  Coherency WDL  Content WDL  Delusions None reported or observed  Perception WDL  Hallucination None reported or observed  Judgment Limited  Confusion None  Danger to Self  Current suicidal ideation? Denies  Agreement Not to Harm Self Yes  Description of Agreement Verbal

## 2024-04-17 NOTE — Group Note (Signed)
 Recreation Therapy Group Note   Group Topic:Stress Management  Group Date: 04/17/2024 Start Time: 0932 End Time: 1058 Facilitators: Elisabel Hanover-McCall, LRT,CTRS Location: [address] Dayroom   Group Topic: Stress Management  Goal Area(s) Addresses:  Patient will identify positive stress management techniques. Patient will identify benefits of using stress management post d/c.  Behavioral Response:   Intervention: Calm App  Activity: Meditation. LRT played a meditation for patients called the [facility] meditation. This meditation focused on taking the strength and resilience that mountains represent and bringing them into your daily life.    Education:  Stress Management, Discharge Planning.   Education Outcome: Acknowledges Education   Affect/Mood: N/A   Participation Level: Did not attend    Clinical Observations/Individualized Feedback:     Plan: Continue to engage patient in RT group sessions 2-3x/week.   Marlisa Caridi-McCall, LRT,CTRS  04/17/2024 1:13 PM

## 2024-04-18 ENCOUNTER — Encounter (HOSPITAL_COMMUNITY): Payer: Self-pay

## 2024-04-18 ENCOUNTER — Encounter (HOSPITAL_COMMUNITY): Payer: Self-pay | Admitting: Nurse Practitioner

## 2024-04-18 DIAGNOSIS — F1994 Other psychoactive substance use, unspecified with psychoactive substance-induced mood disorder: Secondary | ICD-10-CM | POA: Diagnosis not present

## 2024-04-18 LAB — HEMOGLOBIN A1C
Hgb A1c MFr Bld: 6.2 % — ABNORMAL HIGH (ref 4.8–5.6)
Mean Plasma Glucose: 131 mg/dL

## 2024-04-18 MED ORDER — NICOTINE POLACRILEX 2 MG MT GUM
2.0000 mg | CHEWING_GUM | OROMUCOSAL | Status: DC | PRN
  Administered 2024-04-18 – 2024-04-20 (×5): 2 mg via ORAL
  Filled 2024-04-18: qty 1

## 2024-04-18 MED ORDER — VITAMIN D (ERGOCALCIFEROL) 1.25 MG (50000 UNIT) PO CAPS
50000.0000 [IU] | ORAL_CAPSULE | ORAL | Status: DC
  Administered 2024-04-18: 50000 [IU] via ORAL
  Filled 2024-04-18: qty 1

## 2024-04-18 MED ORDER — RISPERIDONE 1 MG PO TBDP
1.0000 mg | ORAL_TABLET | Freq: Two times a day (BID) | ORAL | Status: DC
  Administered 2024-04-18 – 2024-04-19 (×2): 1 mg via ORAL
  Filled 2024-04-18 (×2): qty 1

## 2024-04-18 NOTE — BH IP Treatment Plan (Signed)
 Interdisciplinary Treatment and Diagnostic Plan Update  04/18/2024 Time of Session: 1154 Mark Manning MRN: [id number]  Principal Diagnosis: Substance induced mood disorder (HCC)  Secondary Diagnoses: Principal Problem:   Substance induced mood disorder (HCC)   Current Medications:  Current Facility-Administered Medications  Medication Dose Route Frequency Provider Last Rate Last Admin   acetaminophen (TYLENOL) tablet 650 mg  650 mg Oral Q6H PRN Levester Reagin, NP   650 mg at 04/18/24 0915   FLUoxetine (PROZAC) capsule 20 mg  20 mg Oral Daily Ntuen, Tina C, FNP   20 mg at 04/18/24 4098   hydrOXYzine (ATARAX) tablet 25 mg  25 mg Oral TID PRN Ntuen, Tina C, FNP   25 mg at 04/17/24 2120   LORazepam (ATIVAN) injection 2 mg  2 mg Intramuscular TID PRN Levester Reagin, NP       OLANZapine (ZYPREXA) injection 5 mg  5 mg Intramuscular TID PRN Levester Reagin, NP       OLANZapine (ZYPREXA) tablet 7.5 mg  7.5 mg Oral QHS Ntuen, Tina C, FNP   7.5 mg at 04/17/24 2120   OLANZapine zydis (ZYPREXA) disintegrating tablet 5 mg  5 mg Oral TID PRN Lewis, Tanika N, NP       traZODone (DESYREL) tablet 50 mg  50 mg Oral QHS PRN Ntuen, Tina C, FNP   50 mg at 04/17/24 2120   PTA Medications: Medications Prior to Admission  Medication Sig Dispense Refill Last Dose/Taking   doxycycline (VIBRAMYCIN) 100 MG capsule Take 100 mg by mouth 2 (two) times daily.   Taking   furosemide (LASIX) 20 MG tablet Take 20 mg by mouth daily. X 3 days   Taking   potassium chloride SA (KLOR-CON M) 20 MEQ tablet Take 40 mEq by mouth. X 5 days   Taking    Patient Stressors: Financial difficulties   Substance abuse    Patient Strengths: Capable of independent living  Printmaker for treatment/growth  Religious Affiliation  Work skills   Treatment Modalities: Medication Management, Group therapy, Case management,  1 to 1 session with clinician, Psychoeducation, Recreational therapy.   Physician  Treatment Plan for Primary Diagnosis: Substance induced mood disorder (HCC) Long Term Goal(s): Improvement in symptoms so as ready for discharge   Short Term Goals: Ability to identify changes in lifestyle to reduce recurrence of condition will improve Ability to verbalize feelings will improve Ability to disclose and discuss suicidal ideas Ability to demonstrate self-control will improve Ability to identify and develop effective coping behaviors will improve Ability to maintain clinical measurements within normal limits will improve Compliance with prescribed medications will improve Ability to identify triggers associated with substance abuse/mental health issues will improve  Medication Management: Evaluate patient's response, side effects, and tolerance of medication regimen.  Therapeutic Interventions: 1 to 1 sessions, Unit Group sessions and Medication administration.  Evaluation of Outcomes: Not Progressing  Physician Treatment Plan for Secondary Diagnosis: Principal Problem:   Substance induced mood disorder (HCC)  Long Term Goal(s): Improvement in symptoms so as ready for discharge   Short Term Goals: Ability to identify changes in lifestyle to reduce recurrence of condition will improve Ability to verbalize feelings will improve Ability to disclose and discuss suicidal ideas Ability to demonstrate self-control will improve Ability to identify and develop effective coping behaviors will improve Ability to maintain clinical measurements within normal limits will improve Compliance with prescribed medications will improve Ability to identify triggers associated with substance abuse/mental health issues will improve  Medication Management: Evaluate patient's response, side effects, and tolerance of medication regimen.  Therapeutic Interventions: 1 to 1 sessions, Unit Group sessions and Medication administration.  Evaluation of Outcomes: Not Progressing   RN Treatment  Plan for Primary Diagnosis: Substance induced mood disorder (HCC) Long Term Goal(s): Knowledge of disease and therapeutic regimen to maintain health will improve  Short Term Goals: Ability to remain free from injury will improve, Ability to verbalize frustration and anger appropriately will improve, Ability to demonstrate self-control, Ability to participate in decision making will improve, Ability to verbalize feelings will improve, Ability to disclose and discuss suicidal ideas, Ability to identify and develop effective coping behaviors will improve, and Compliance with prescribed medications will improve  Medication Management: RN will administer medications as ordered by provider, will assess and evaluate patient's response and provide education to patient for prescribed medication. RN will report any adverse and/or side effects to prescribing provider.  Therapeutic Interventions: 1 on 1 counseling sessions, Psychoeducation, Medication administration, Evaluate responses to treatment, Monitor vital signs and CBGs as ordered, Perform/monitor CIWA, COWS, AIMS and Fall Risk screenings as ordered, Perform wound care treatments as ordered.  Evaluation of Outcomes: Not Progressing   LCSW Treatment Plan for Primary Diagnosis: Substance induced mood disorder (HCC) Long Term Goal(s): Safe transition to appropriate next level of care at discharge, Engage patient in therapeutic group addressing interpersonal concerns.  Short Term Goals: Engage patient in aftercare planning with referrals and resources, Increase social support, Increase ability to appropriately verbalize feelings, Increase emotional regulation, Facilitate acceptance of mental health diagnosis and concerns, Facilitate patient progression through stages of change regarding substance use diagnoses and concerns, Identify triggers associated with mental health/substance abuse issues, and Increase skills for wellness and recovery  Therapeutic  Interventions: Assess for all discharge needs, 1 to 1 time with Social worker, Explore available resources and support systems, Assess for adequacy in community support network, Educate family and significant other(s) on suicide prevention, Complete Psychosocial Assessment, Interpersonal group therapy.  Evaluation of Outcomes: Not Progressing   Progress in Treatment: Attending groups: No. Participating in groups: No. Taking medication as prescribed: Yes. Toleration medication: Yes. Family/Significant other contact made: No, will contact:  PSA consents pending Patient understands diagnosis: Yes. Discussing patient identified problems/goals with staff: Yes. Medical problems stabilized or resolved: Yes. Denies suicidal/homicidal ideation: Yes. Issues/concerns per patient self-inventory: No. Other: n/a  New problem(s) identified: No, Describe:  None  New Short Term/Long Term Goal(s): detox, medication management for mood stabilization; elimination of SI thoughts; development of comprehensive mental wellness/sobriety plan  Patient Goals: "Think a little better, be in a stable environment, and of my next move"  Discharge Plan or Barriers: Patient recently admitted. CSW will continue to follow and assess for appropriate referrals and possible discharge planning.    Reason for Continuation of Hospitalization: Anxiety Delusions  Medication stabilization Other; describe Mood stabilization, discharge planning  Estimated Length of Stay: 3-5 DAYS   Last 3 [place] Suicide Severity Risk Score: Flowsheet Row Ad[facility] (Current) [facility] Most recent reading at 04/16/2024 11:45 AM ED from [facility]16/2024 in [facility] Most recent reading at 04/16/2024  3:09 AM  C-SSRS RISK CATEGORY No Risk No Risk       Last PHQ 2/9 Scores:     No data to display          Scribe for Treatment Team: Cherine Drumgoole N Darcell Sabino,  LCSW 04/18/2024 9:41 AM

## 2024-04-18 NOTE — Plan of Care (Signed)
   Problem: Education: Goal: Knowledge of [facility] Education information/materials will improve Outcome: Progressing Goal: Emotional status will improve Outcome: Progressing Goal: Mental status will improve Outcome: Progressing Goal: Verbalization of understanding the information provided will improve Outcome: Progressing

## 2024-04-18 NOTE — Plan of Care (Signed)
   Problem: Education: Goal: Emotional status will improve Outcome: Progressing Goal: Mental status will improve Outcome: Progressing   Problem: Activity: Goal: Interest or engagement in activities will improve Outcome: Progressing Goal: Sleeping patterns will improve Outcome: Progressing   Problem: Safety: Goal: Periods of time without injury will increase Outcome: Progressing

## 2024-04-18 NOTE — Group Note (Signed)
 Date:  04/18/2024 Time:  9:08 AM  Group Topic/Focus:  Goals Group:   The focus of this group is to help patients establish daily goals to achieve during treatment and discuss how the patient can incorporate goal setting into their daily lives to aide in recovery. Orientation:   The focus of this group is to educate the patient on the purpose and policies of crisis stabilization and provide a format to answer questions about their admission.  The group details unit policies and expectations of patients while admitted.    Participation Level:  Did Not Attend

## 2024-04-18 NOTE — Group Note (Signed)
 Recreation Therapy Group Note   Group Topic:Animal Assisted Therapy   Group Date: 04/18/2024 Start Time: 0946 End Time: 1030 Facilitators: Archie Atilano-McCall, LRT,CTRS Location: [address] Dayroom   Animal-Assisted Activity (AAA) Program Checklist/Progress Notes Patient Eligibility Criteria Checklist & Daily Group note for Rec Tx Intervention  AAA/T Program Assumption of Risk Form signed by Patient/ or Parent Legal Guardian Yes  Patient understands his/her participation is voluntary Yes  Behavioral Response:    Education: Charity fundraiser, Appropriate Animal Interaction   Education Outcome: Acknowledges education.    Affect/Mood: N/A   Participation Level: Did not attend    Clinical Observations/Individualized Feedback:     Plan: Continue to engage patient in RT group sessions 2-3x/week.   Mark Manning, LRT,CTRS  04/18/2024 1:17 PM

## 2024-04-18 NOTE — Group Note (Signed)
 LCSW Group Therapy Note   Group Date: 04/18/2024 Start Time: 1100 End Time: 1200   Participation:  patient was present.  He listened but didn't participate in the conversation.  Type of Therapy:  Group Therapy  Topic:  Speaking from the Heart: Communicating with Understanding and Empathy  Objective:  To help participants develop effective communication skills to express themselves clearly, listen actively, and navigate conflicts in a healthy way.  Goals: Increase awareness of verbal and non-verbal communication skills. Practice using "I" statements and active listening techniques. Learn coping strategies for managing communication stress.  Summary:  Participants explored the importance of communication, discussed challenges, and practiced skills such as active listening and assertive expression. They reflected on past experiences and identified ways to improve communication in their daily lives.  Therapeutic Modalities: Cognitive-Behavioral Therapy (CBT): Restructuring negative thought patterns in communication. Mindfulness: Staying present and calm during conversations. Psychoeducation: Learning about effective communication techniques.   Lysbeth Dicola O Jahmeer Porche, LCSWA 04/18/2024  5:52 PM

## 2024-04-18 NOTE — Progress Notes (Signed)
   04/18/24 2315  Psych Admission Type (Psych Patients Only)  Admission Status Voluntary  Psychosocial Assessment  Patient Complaints None  Eye Contact Fair  Facial Expression Sad  Affect Appropriate to circumstance  Speech Logical/coherent  Interaction Assertive  Motor Activity Slow  Appearance/Hygiene Unremarkable  Behavior Characteristics Cooperative  Mood Pleasant  Aggressive Behavior  Effect No apparent injury  Thought Process  Coherency Circumstantial  Content Blaming others;Blaming self  Delusions Paranoid;Persecutory  Perception WDL  Hallucination None reported or observed  Judgment Poor  Confusion WDL  Danger to Self  Current suicidal ideation? Denies  Danger to Others  Danger to Others None reported or observed

## 2024-04-18 NOTE — [facility] Suicide Risk Assessment (Signed)
 [facility] INPATIENT:  Family/Significant Other Suicide Prevention Education  Suicide Prevention Education:  Contact Attempts: Mark Manning (mom) [phone], (name of family member/significant other) has been identified by the patient as the family member/significant other with whom the patient will be residing, and identified as the person(s) who will aid the patient in the event of a mental health crisis.    Date and time of first attempt:  04/18/2024/6:05 PM  CSW left a voicemail.   Mark Manning, LCSWA 04/18/2024, 6:23 PM

## 2024-04-18 NOTE — [facility] Suicide Risk Assessment (Signed)
 [facility] INPATIENT:  Family/Significant Other Suicide Prevention Education  Suicide Prevention Education:  Education Completed; [name] (sister) [phone],  (name of family member/significant other) has been identified by the patient as the family member/significant other with whom the patient will be residing, and identified as the person(s) who will aid the patient in the event of a mental health crisis (suicidal ideations/suicide attempt).  With written consent from the patient, the family member/significant other has been provided the following suicide prevention education, prior to the and/or following the discharge of the patient.  Sister will not be able to pick up patient upon discharge and transport him to his apartment, but added that maybe their other sister, [name], [phone], could possibly pick him up.  Sister said that [name] brought him to the hospital.  Sister wasn't sure if patient can return to his apartment because he may be evicted; his court hearing was supposed to be this week.  Sister said that patient doesn't have any guns or weapons.  When asked if she has safety concerns about patient coming home, she said that patient is paranoid, can't sleep, goes from house to house, his feet are swollen from walking because  he is afraid people want to kill him.  He experienced these symptoms at work, and lost his job (sometimes he was fine at work, and sometimes experienced symptoms.    Sister said that patient has been struggling with substance use for 17 years, and was incarcerated.  The suicide prevention education provided includes the following: Suicide risk factors Suicide prevention and interventions National Suicide Hotline telephone number [facility] assessment telephone number [facility] Emergency Assistance 911 [facility] and/or Residential Mobile Crisis Unit telephone number  Request made of family/significant other to: Remove weapons  (e.g., guns, rifles, knives), all items previously/currently identified as safety concern.    The family member/significant other verbalizes understanding of the suicide prevention education information provided.  The family member/significant other agrees to remove the items of safety concern listed above.  Mark Manning Mark Manning, LCSWA 04/18/2024, 6:24 PM
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
## 2024-04-18 NOTE — Plan of Care (Addendum)
 Pt presents with fair eye contact, logical speech, circumstantial on interactions.  Pt continues to paranoid, delusional, believes he's still being tracked, followed by his enemies on the outside. Per pt "I do feel safe here. I tell my family that, but I know this male I used to mess with, told some people stuff about me. I know they are waiting for me when I get out; the situation still waits for me when I discharge". Denies SI, HI and AVH.  Rates his pain 7/10 (bilateral leg edema, +1 nonpitting). Treatment team made aware of pt's edema to legs. PRN Tylenol 650 mg PO given at 0915 with desired effect. Visible in milieu for scheduled groups. Off unit for meals, returned without issues. Emotional support, reassurance and encouragement offered. Safety checks maintained at Q 15 minutes intervals without incident.   Problem: Activity: Goal: Interest or engagement in activities will improve Outcome: Progressing Goal: Sleeping patterns will improve Outcome: Progressing   Problem: Health Behavior/Discharge Planning: Goal: Compliance with treatment plan for underlying cause of condition will improve Outcome: Progressing

## 2024-04-18 NOTE — Progress Notes (Signed)
 Collateral contact   Per patient's request, CSW emailed a letter excusing him from participation in today's court hearing to his probation officer in [place], [email or web address] via secure email.     Loucile Posner, LCSWA 04/18/2024

## 2024-04-19 DIAGNOSIS — F1994 Other psychoactive substance use, unspecified with psychoactive substance-induced mood disorder: Secondary | ICD-10-CM | POA: Diagnosis not present

## 2024-04-19 LAB — COMPREHENSIVE METABOLIC PANEL WITH GFR
ALT: 27 U/L (ref 0–44)
AST: 30 U/L (ref 15–41)
Albumin: 3.2 g/dL — ABNORMAL LOW (ref 3.5–5.0)
Alkaline Phosphatase: 56 U/L (ref 38–126)
Anion gap: 8 (ref 5–15)
BUN: 12 mg/dL (ref 6–20)
CO2: 23 mmol/L (ref 22–32)
Calcium: 8.6 mg/dL — ABNORMAL LOW (ref 8.9–10.3)
Chloride: 106 mmol/L (ref 98–111)
Creatinine, Ser: 1.04 mg/dL (ref 0.61–1.24)
GFR, Estimated: >60 mL/min (ref >60)
Glucose, Bld: 123 mg/dL — ABNORMAL HIGH (ref 70–99)
Potassium: 3.2 mmol/L — ABNORMAL LOW (ref 3.5–5.1)
Sodium: 137 mmol/L (ref 135–145)
Total Bilirubin: 0.5 mg/dL (ref 0.0–1.2)
Total Protein: 6.3 g/dL — ABNORMAL LOW (ref 6.5–8.1)

## 2024-04-19 LAB — CBC WITH DIFFERENTIAL/PLATELET
Abs Immature Granulocytes: 0.02 10*3/uL (ref 0.00–0.07)
Basophils Absolute: 0 10*3/uL (ref 0.0–0.1)
Basophils Relative: 1 %
Eosinophils Absolute: 0.1 10*3/uL (ref 0.0–0.5)
Eosinophils Relative: 2 %
HCT: 39 % (ref 39.0–52.0)
Hemoglobin: 12.2 g/dL — ABNORMAL LOW (ref 13.0–17.0)
Immature Granulocytes: 0 %
Lymphocytes Relative: 31 %
Lymphs Abs: 1.7 10*3/uL (ref 0.7–4.0)
MCH: 28.4 pg (ref 26.0–34.0)
MCHC: 31.3 g/dL (ref 30.0–36.0)
MCV: 90.7 fL (ref 80.0–100.0)
Monocytes Absolute: 0.4 10*3/uL (ref 0.1–1.0)
Monocytes Relative: 8 %
Neutro Abs: 3.1 10*3/uL (ref 1.7–7.7)
Neutrophils Relative %: 58 %
Platelets: 320 10*3/uL (ref 150–400)
RBC: 4.3 MIL/uL (ref 4.22–5.81)
RDW: 13.5 % (ref 11.5–15.5)
WBC: 5.4 10*3/uL (ref 4.0–10.5)
nRBC: 0 % (ref 0.0–0.2)

## 2024-04-19 MED ORDER — RISPERIDONE 2 MG PO TBDP
2.0000 mg | ORAL_TABLET | Freq: Every day | ORAL | Status: DC
  Administered 2024-04-20: 2 mg via ORAL
  Filled 2024-04-19: qty 1

## 2024-04-19 MED ORDER — RISPERIDONE 1 MG PO TBDP
1.0000 mg | ORAL_TABLET | Freq: Every day | ORAL | Status: DC
  Administered 2024-04-20 – 2024-04-21 (×2): 1 mg via ORAL
  Filled 2024-04-19 (×2): qty 1

## 2024-04-19 NOTE — Group Note (Signed)
 Recreation Therapy Group Note   Group Topic:Team Building  Group Date: 04/19/2024 Start Time: 0945 End Time: 1005 Facilitators: Janelis Stelzer-McCall, LRT,CTRS Location: [address] Day[address]   Group Topic: Communication, Team Building, Problem Solving  Goal Area(s) Addresses:  Patient will effectively work with peer towards shared goal.  Patient will identify skills used to make activity successful.  Patient will identify how skills used during activity can be used to reach post d/c goals.   Behavioral Response: Engaged  Intervention: STEM Activity  Activity: Stage manager. In teams of 3-5, patients were given 12 plastic drinking straws and an equal length of masking tape. Using the materials provided, patients were asked to build a landing pad to catch a golf ball dropped from approximately 5 feet in the air. All materials were required to be used by the team in their design. LRT facilitated post-activity discussion.  Education: Pharmacist, community, Scientist, physiological, Discharge Planning   Education Outcome: Acknowledges education/In group clarification offered/Needs additional education.    Affect/Mood: Appropriate   Participation Level: Engaged   Participation Quality: Independent   Behavior: Appropriate   Speech/Thought Process: Focused   Insight: Good   Judgement: Good   Modes of Intervention: STEM Activity   Patient Response to Interventions:  Engaged   Education Outcome:  In group clarification offered    Clinical Observations/Individualized Feedback: Pt attended and participated in group session.      Plan: Continue to engage patient in RT group sessions 2-3x/week.   Pratham Cassatt-McCall, LRT,CTRS 04/19/2024 1:30 PM

## 2024-04-19 NOTE — Group Note (Signed)
 Date:  04/19/2024 Time:  5:21 AM  Group Topic/Focus:  Wrap-Up Group:   The focus of this group is to help patients review their daily goal of treatment and discuss progress on daily workbooks.    Participation Level:  Active  Participation Quality:  Appropriate and Sharing  Affect:  Appropriate  Cognitive:  Appropriate  Insight: Appropriate  Engagement in Group:  Engaged  Modes of Intervention:  Socialization  Additional Comments:  Patient used wrap up group sheet as guide when sharing. Patient rated his day a 8 out of 10, today was an 8 kind of day. Patient goal for today, my goal for today was to think positive and patient did achieve goal, yes. Coping skills the patient finds most helpful, remembering that there is help out there. Something the patient likes about himself. That I am humble.  Patient did have questions/concerns; will there be help in my transition and place to store belongings. Writer informed patient that Child psychotherapist will be able to assist with those questions/concerns.   Dillard Frame 04/19/2024, 5:21 AM

## 2024-04-19 NOTE — [facility] Group Notes (Signed)
 [facility] Group Notes:  (Nursing/MHT/Case Management/Adjunct)  Date:  04/19/2024  Time:  8:49 PM  Type of Therapy:  NA Group  Participation Level:  Active  Participation Quality:  Appropriate  Affect:  Appropriate  Cognitive:  Appropriate  Insight:  Appropriate  Engagement in Group:  Engaged  Modes of Intervention:  Education  Summary of Progress/Problems:Attended NA meeting.  Mark Manning 04/19/2024, 8:49 PM

## 2024-04-19 NOTE — Progress Notes (Addendum)
 Collateral contact   [facility] [address] [place], [place] [address] [phone]  CSW was informed that patient needs to call [name] for an interview.  Conversation with patient: Patient said that he was accepted into [employer] [facility] [place] program, and will go there on Saturday, 04/22/2024.  He said his sister, [name], or the mother of his children, can pick him up from the hospital on 6/13, and take him to his apartment in [place].  He is in the process of eviction and will place his belongings in the storage before he goes to the treatment.  Patient said that he wants to work and was excited about going into the treatment.   [name], LCSWA 04/19/2024

## 2024-04-19 NOTE — [facility] Group Notes (Signed)
 Spirituality Group   Focus of discussion: Gratitude and Strength Awareness   Process: Following theoretical framework of group therapy of Irvin Yalom and further informed by Rogerian and Relational Cultural Theory approaches, participants invited to name:   Sources of gratitude (internal>external)   Articulate gratitude for self   Name a personal strength/gift/skill   Locate points of resonance among group members/engage the here and now   Conclude with grounding/breathwork       Observations: Mark Manning was very engaged in the group discussion. He demonstrated mutual empathy with peers.  [name] Aly, M.Div [phone]

## 2024-04-19 NOTE — [facility] Suicide Risk Assessment (Addendum)
 [facility] INPATIENT:  Family/Significant Other Suicide Prevention Education  Suicide Prevention Education:  Contact Attempts: Mark Manning (sister) [phone], (name of family member/significant other) has been identified by the patient as the family member/significant other with whom the patient will be residing, and identified as the person(s) who will aid the patient in the event of a mental health crisis.    Date and time of first attempt:04/19/2024/8:34 PM The phone rang but there was no answer.  The message said that the voicemail box hasn't been set up yet.   [name] [name], LCSWA 04/19/2024, 8:34 PM

## 2024-04-19 NOTE — Plan of Care (Signed)
  Problem: Coping: Goal: Ability to demonstrate self-control will improve Outcome: Progressing   Problem: Health Behavior/Discharge Planning: Goal: Identification of resources available to assist in meeting health care needs will improve Outcome: Progressing   Problem: Safety: Goal: Periods of time without injury will increase Outcome: Progressing

## 2024-04-19 NOTE — Progress Notes (Signed)
 [facility] MD Progress Note  04/19/2024 3:20 PM BLADE SCHEFF  MRN:  [id number]  Principal Problem: Substance induced mood disorder (HCC) Diagnosis: Principal Problem:   Substance induced mood disorder (HCC)  Reason for admission:  This is the first inpatient psychiatric mental health admission for this 43 year old African-American male to [facility] for  treatment of paranoia due to polysubstance use disorder.  Patient presented to [facility] behavioral health urgent care for evaluation of his mental health in the context of feeling unsafe in his neighborhood, reporting,  I was hacked during cyber attack and lost my job behind it. After medical evaluation / stabilization & clearance, he was transferred to the [facility] for further psychiatric evaluation & treatments.  BAL less than 15, UDS positive for cocaine and marijuana.   24-hour chart review: Vital signs reviewed without critical values.   PRNs required of Tylenol for mild pain, nicotine gum for smoking cessation, hydroxyzine for anxiety,, and trazodone for insomnia.  No agitation protocol required.  Patient's case discussed in the interdisciplinary team meeting.  Today's assessment notes: Patient reports his mood is less depressed.  Rates depression as #1/10, with 10 being high severity.  Chart reviewed, findings shared with the treatment team and consult with attending psychiatrist.  Patient continues  on Prozac 20 mg p.o. daily for depression and anxiety.  Attention to hygiene much improved.  Speech is clear, with normal volume and pattern.  Patient reports his paranoid preoccupation is minimizing since starting Risperdal disintegrating tablet 1 mg p.o. every 12 hours.  Risperdal disintegrating tablet increased from 1 mg p.o. twice daily to 1 mg in a.m. and 2 mg at bedtime.  We will continue to monitor therapeutic effect of Risperdal.  Patient relates to this provider that he would prefer long-acting injectable  rather than taking pills every day.  We will plan for LAI Risperdal prior to patient's discharge.  Denies SI, HI, or AVH.  Labs reviewed with CBC: Hemoglobin 12.2, improved from 11.8. CMP: Potassium level 3.2, replaced with potassium chloride 40 mEq x 2 doses, glucose 123, albumin 3.2 and total protein 6.3.  Labs results and lower feet edema could be related to nutritional status prior to patient's admission. Lower feet edema much improved without pitting.  Patient able to ambulate around the unit and participating in groups and therapeutic milieu.  No acute discomfort voiced or complaint of chest pain. Reports that anxiety is at manageable level. Sleep is improving Appetite is good Concentration is better Energy level is adequate Denies suicidal thoughts.  Further denies suicidal intent and plan.  Denies having any HI.  Denies having psychotic symptoms.    Denies having side effects to current psychiatric medications.    We discussed changes to current medication regimen, including switching  Zyprexa to Risperdal disintegrating tablets   1  mg p.o. every 12 hours  for psychosis, due to noneffectiveness.  Patient is in agreement with medication adjustment.   Total Time spent with patient: 45 minutes  Past Psychiatric History:  Current/prior outpatient treatment: Denies, however, per chart review, patient has been seen at [facility] outpatient in 2019 Prior rehab hx: Denies Psychotherapy hx: Denies History of suicide: Denies History of homicide or aggression: Denies Psychiatric medication history: Denies Psychiatric medication compliance history: Denies being on psychotropic medications Neuromodulation history: Denies Current Psychiatrist: Denies Current therapist: Denies   Past Medical History: History reviewed. No pertinent past medical history. History reviewed. No pertinent surgical history. Family History: History reviewed. No pertinent family  history. Family  Psychiatric  History:   See H&P Social History:  Social History   Substance and Sexual Activity  Alcohol Use Yes   Comment: occasionally     Social History   Substance and Sexual Activity  Drug Use Yes   Types: Cocaine, Marijuana   Comment: last use 01/18/18    Social History   Socioeconomic History   Marital status: Married    Spouse name: Not on file   Number of children: Not on file   Years of education: Not on file   Highest education level: Not on file  Occupational History   Not on file  Tobacco Use   Smoking status: Every Day    Current packs/day: 1.00    Types: Cigarettes   Smokeless tobacco: Never  Vaping Use   Vaping status: Former  Substance and Sexual Activity   Alcohol use: Yes    Comment: occasionally   Drug use: Yes    Types: Cocaine, Marijuana    Comment: last use 01/18/18   Sexual activity: Not on file  Other Topics Concern   Not on file  Social History Narrative   Not on file   Social Drivers of Health   Financial Resource Strain: Not on file  Food Insecurity: Food Insecurity Present (04/16/2024)   Hunger Vital Sign    Worried About Running Out of Food in the Last Year: Often true    Ran Out of Food in the Last Year: Often true  Transportation Needs: Unmet Transportation Needs (04/16/2024)   PRAPARE - Administrator, Civil Service (Medical): Yes    Lack of Transportation (Non-Medical): Yes  Physical Activity: Not on file  Stress: Not on file  Social Connections: Not on file   Additional Social History:    Sleep: Good  Appetite:  Good  Current Medications: Current Facility-Administered Medications  Medication Dose Route Frequency Provider Last Rate Last Admin   acetaminophen (TYLENOL) tablet 650 mg  650 mg Oral Q6H PRN Levester Reagin, NP   650 mg at 04/18/24 0915   FLUoxetine (PROZAC) capsule 20 mg  20 mg Oral Daily Bijan Ridgley C, FNP   20 mg at 04/19/24 0839   hydrOXYzine (ATARAX) tablet 25 mg  25 mg Oral TID PRN Anaih Brander C, FNP   25 mg at  04/18/24 2112   LORazepam (ATIVAN) injection 2 mg  2 mg Intramuscular TID PRN Levester Reagin, NP       nicotine polacrilex (NICORETTE) gum 2 mg  2 mg Oral PRN Ellionna Buckbee C, FNP   2 mg at 04/19/24 1250   OLANZapine (ZYPREXA) injection 5 mg  5 mg Intramuscular TID PRN Levester Reagin, NP       OLANZapine zydis (ZYPREXA) disintegrating tablet 5 mg  5 mg Oral TID PRN Levester Reagin, NP       [START ON 04/20/2024] risperiDONE (RISPERDAL M-TABS) disintegrating tablet 1 mg  1 mg Oral Daily Zouev, Dmitri, MD       [START ON 04/20/2024] risperiDONE (RISPERDAL M-TABS) disintegrating tablet 2 mg  2 mg Oral QHS Zouev, Dmitri, MD       traZODone (DESYREL) tablet 50 mg  50 mg Oral QHS PRN Yolani Vo C, FNP   50 mg at 04/18/24 2112   Vitamin D (Ergocalciferol) (DRISDOL) 1.25 MG (50000 UNIT) capsule 50,000 Units  50,000 Units Oral Q7 days Stefan Markarian C, FNP   50,000 Units at 04/18/24 1702   Lab Results:  Results for  orders placed or performed during the hospital encounter of 04/16/24 (from the past 48 hours)  CBC with Differential/Platelet     Status: Abnormal   Collection Time: 04/19/24  6:48 AM  Result Value Ref Range   WBC 5.4 4.0 - 10.5 K/uL   RBC 4.30 4.22 - 5.81 MIL/uL   Hemoglobin 12.2 (L) 13.0 - 17.0 g/dL   HCT 40.9 81.1 - 91.4 %   MCV 90.7 80.0 - 100.0 fL   MCH 28.4 26.0 - 34.0 pg   MCHC 31.3 30.0 - 36.0 g/dL   RDW 78.2 95.6 - 21.3 %   Platelets 320 150 - 400 K/uL   nRBC 0.0 0.0 - 0.2 %   Neutrophils Relative % 58 %   Neutro Abs 3.1 1.7 - 7.7 K/uL   Lymphocytes Relative 31 %   Lymphs Abs 1.7 0.7 - 4.0 K/uL   Monocytes Relative 8 %   Monocytes Absolute 0.4 0.1 - 1.0 K/uL   Eosinophils Relative 2 %   Eosinophils Absolute 0.1 0.0 - 0.5 K/uL   Basophils Relative 1 %   Basophils Absolute 0.0 0.0 - 0.1 K/uL   Immature Granulocytes 0 %   Abs Immature Granulocytes 0.02 0.00 - 0.07 K/uL    Comment: Performed at [facility], [address] [place], [place] [address]   Comprehensive metabolic panel     Status: Abnormal   Collection Time: 04/19/24  6:48 AM  Result Value Ref Range   Sodium 137 135 - 145 mmol/L   Potassium 3.2 (L) 3.5 - 5.1 mmol/L   Chloride 106 98 - 111 mmol/L   CO2 23 22 - 32 mmol/L   Glucose, Bld 123 (H) 70 - 99 mg/dL    Comment: Glucose reference range applies only to samples taken after fasting for at least 8 hours.   BUN 12 6 - 20 mg/dL   Creatinine, Ser 8.46 0.61 - 1.24 mg/dL   Calcium 8.6 (L) 8.9 - 10.3 mg/dL   Total Protein 6.3 (L) 6.5 - 8.1 g/dL   Albumin 3.2 (L) 3.5 - 5.0 g/dL   AST 30 15 - 41 U/L   ALT 27 0 - 44 U/L   Alkaline Phosphatase 56 38 - 126 U/L   Total Bilirubin 0.5 0.0 - 1.2 mg/dL   GFR, Estimated >96 >29 mL/min    Comment: (NOTE) Calculated using the CKD-EPI Creatinine Equation (2021)    Anion gap 8 5 - 15    Comment: Performed at [facility], [address], [place], [place] [address]   Blood Alcohol level:  Lab Results  Component Value Date   [facility] <15 04/16/2024   ETH <10 01/19/2018   Metabolic Disorder Labs: Lab Results  Component Value Date   HGBA1C 6.2 (H) 04/16/2024   MPG 131 04/16/2024   MPG 131 04/16/2024   No results found for: PROLACTIN Lab Results  Component Value Date   CHOL 126 04/16/2024   TRIG 88 04/16/2024   HDL 46 04/16/2024   CHOLHDL 2.7 04/16/2024   VLDL 18 04/16/2024   LDLCALC 62 04/16/2024   Physical Findings: AIMS:  , ,  ,  ,    CIWA:    COWS:     Musculoskeletal: Strength & Muscle Tone: within normal limits Gait & Station: normal Patient leans: N/A  Psychiatric Specialty Exam:  Presentation  General Appearance:  Appropriate for Environment; Casual; Fairly Groomed  Eye Contact: Good  Speech: Clear and Coherent  Speech Volume: Normal  Handedness:  Right  Mood and Affect  Mood: Anxious; Depressed  Affect: Congruent  Thought Process  Thought Processes: Coherent; Goal Directed  Descriptions of  Associations:Intact  Orientation:Full (Time, Place and Person)  Thought Content:Logical  History of Schizophrenia/Schizoaffective disorder:No  Duration of Psychotic Symptoms:Greater than six months  Hallucinations:Hallucinations: -- (Denies) Description of Visual Hallucinations: Denies discipline on estimation today  Ideas of Reference:Paranoia (Residual paranoia minimizing)  Suicidal Thoughts:Suicidal Thoughts: No  Homicidal Thoughts:Homicidal Thoughts: No  Sensorium  Memory: Immediate Good; Recent Good  Judgment: Fair  Insight: Fair  Art therapist  Concentration: Good  Attention Span: Good  Recall: Fair  Fund of Knowledge: Fair  Language: Good  Psychomotor Activity  Psychomotor Activity: Psychomotor Activity: Normal  Assets  Assets: Communication Skills; Physical Health; Resilience  Sleep  Sleep: Sleep: Good Number of Hours of Sleep: 7.75  Physical Exam: Physical Exam Vitals and nursing note reviewed.  Constitutional:      Appearance: He is normal weight.  HENT:     Head: Normocephalic.     Right Ear: External ear normal.     Left Ear: External ear normal.     Nose: Nose normal.     Mouth/Throat:     Mouth: Mucous membranes are moist.     Pharynx: Oropharynx is clear.  Eyes:     Extraocular Movements: Extraocular movements intact.  Cardiovascular:     Rate and Rhythm: Normal rate.     Pulses: Normal pulses.  Pulmonary:     Effort: Pulmonary effort is normal.  Abdominal:     Comments: Deferred  Genitourinary:    Comments: Deferred Musculoskeletal:        General: Normal range of motion.     Cervical back: Normal range of motion.  Skin:    General: Skin is warm.  Neurological:     General: No focal deficit present.     Mental Status: He is alert and oriented to person, place, and time.  Psychiatric:        Mood and Affect: Mood normal.        Behavior: Behavior normal.    Review of Systems  Constitutional:  Negative  for chills and fever.  HENT:  Negative for sore throat.   Eyes:  Negative for blurred vision.  Respiratory:  Negative for cough, sputum production, shortness of breath and wheezing.   Cardiovascular:  Negative for chest pain and palpitations.  Gastrointestinal:  Negative for heartburn, nausea and vomiting.  Genitourinary:  Negative for dysuria, frequency and urgency.  Musculoskeletal:  Negative for falls.  Skin:  Negative for itching and rash.  Neurological:  Negative for dizziness and headaches.  Endo/Heme/Allergies:        See allergy listing.  Psychiatric/Behavioral:  Positive for depression. Negative for hallucinations, substance abuse and suicidal ideas. The patient is nervous/anxious. The patient does not have insomnia.    Blood pressure 117/74, pulse 82, temperature 97.9 F (36.6 C), temperature source Oral, resp. rate 16, height 5' 10 (1.778 m), weight 88.5 kg, SpO2 100%. Body mass index is 27.98 kg/m.  Treatment Plan Summary: Daily contact with patient to assess and evaluate symptoms and progress in treatment and Medication management Physician Treatment Plan for Secondary Diagnosis: Assessment:  This is the first inpatient psychiatric mental health admission for this 43 year old African-American male to [facility] for  treatment of paranoia due to polysubstance use disorder.  Patient presented to [facility] behavioral health urgent care for evaluation of his mental health in the context of  feeling unsafe in his neighborhood, reporting,  I was hacked during cyber attack and lost my job behind it.     Principal Problem:   Substance induced mood disorder (HCC)   Plan: Medication: --Continue Prozac 20 mg p.o. daily for depression and anxiety --Discontinue olanzapine 7.5 mg p.o. daily at bedtime for psychosis due to no n-effectiveness --Initiate Risperdal disintegrating tablet 1 mg p.o. every 12 hours for psychosis 04/18/24 --Increase Risperdal  disintegrating tablet to 1 mg in AM and 2 mg PO at bedtime for psychosis 04/19/24 --Trazodone tablet 50 mg p.o. as needed at bedtime for insomnia --Hydroxyzine tablet 25 mg p.o. 3 times daily as needed for anxiety   Other PRN Medications  -Acetaminophen 650 mg every 6 as needed/mild pain  -Maalox 30 mL oral every 4 as needed/digestion  -Magnesium hydroxide 30 mL daily as needed/mild constipation    --The risks/benefits/side-effects/alternatives to this medication were discussed in detail with the patient and time was given for questions. The patient consents to medication trial.   -- Metabolic profile and EKG monitoring obtained while on an atypical antipsychotic (BMI: Lipid Panel: HbgA1c: QTc:)   -- Encouraged patient to participate in unit milieu and in scheduled group therapies    Continue BH Agitation Protocol  --Haldol 5 mg, oral, 3 times daily as needed, mild agitation  --Benadryl 50 mg, oral, 3 times daily as needed, mild agitation                                      OR   --Haldol injection 5 mg, IM, 3 times daily as needed, moderate agitation  --Benadryl injection 50 mg, IM, 3 times daily as needed, moderate agitation  --Ativan injection 2 mg, IM, 3 times daily as needed, moderate agitation                                      OR  --Haldol injection 10 mg, IM, 3 times daily as needed, severe agitation  --Benadryl injection 50 mg, IM, 3 times daily as needed, severe agitation  --Ativan injection 2 mg, IM, 3 times daily as needed, severe agitation    Admission labs reviewed: CMP: Glucose 105 elevated, albumin 3.4 low, otherwise normal.  Lipid profile: Within normal limits.  CBC with differential: RBC 4.16 low, hemoglobin 11.8 low, HCT 37.9 low, otherwise normal.  TSH: 0.501 normal.  BAL: Less than 15 normal.  UDS: Positive for cocaine, positive for marijuana.   New labs ordered: Hemoglobin A1c,: : 6.2, vitamin D 25-hydroxy   : Vitamin D is  17.61  EKG reviewed: Normal sinus rhythm,  ventricular rate 79, QT/QTc 358/410.   Safety and Monitoring:  Voluntary admission to inpatient psychiatric unit for safety, stabilization and treatment  Daily contact with patient to assess and evaluate symptoms and progress in treatment  Patient's case to be discussed in multi-disciplinary team meeting  Observation Level : q15 minute checks  Vital signs: q12 hours  Precautions: suicide, but pt currently verbally contracts for safety on unit?    Discharge Planning:  Social work and case management to assist with discharge planning and identification of hospital follow-up needs prior to discharge  Estimated LOS: 5-7?days  Discharge Concerns: Need to establish a safety plan; Medication compliance and effectiveness  Discharge Goals: Return home with outpatient referrals for mental health follow-up including  medication management/psychotherapy.     Laurence Pons, FNP 04/19/2024, 3:20 PM Patient ID: Bolling Bushy, male   DOB: 1981-04-04, 43 y.o.   MRN: [id number]

## 2024-04-19 NOTE — [facility] Group Notes (Signed)
 Spiritual care group on grief and loss facilitated by Chaplain Dyanne Carrel, [facility]  Group Goal: Support / Education around grief and loss  Members engage in facilitated group support and psycho-social education.  Group Description:  Following introductions and group rules, group members engaged in facilitated group dialogue and support around topic of loss, with particular support around experiences of loss in their lives. Group Identified types of loss (relationships / self / things) and identified patterns, circumstances, and changes that precipitate losses. Reflected on thoughts / feelings around loss, normalized grief responses, and recognized variety in grief experience. Group encouraged individual reflection on safe space and on the coping skills that they are already utilizing.  Group drew on Adlerian / Rogerian and narrative framework  Patient Progress: Pt attended group and actively engaged and participated in group conversation and activities.

## 2024-04-20 ENCOUNTER — Other Ambulatory Visit (HOSPITAL_COMMUNITY): Payer: Self-pay

## 2024-04-20 DIAGNOSIS — F1994 Other psychoactive substance use, unspecified with psychoactive substance-induced mood disorder: Secondary | ICD-10-CM | POA: Diagnosis not present

## 2024-04-20 MED ORDER — PANTOPRAZOLE SODIUM 40 MG PO TBEC
40.0000 mg | DELAYED_RELEASE_TABLET | Freq: Every day | ORAL | Status: DC
Start: 2024-04-20 — End: 2024-04-21
  Administered 2024-04-20 – 2024-04-21 (×2): 40 mg via ORAL
  Filled 2024-04-20: qty 7
  Filled 2024-04-20 (×2): qty 1

## 2024-04-20 MED ORDER — RISPERIDONE MICROSPHERES ER 25 MG IM SRER
25.0000 mg | INTRAMUSCULAR | Status: DC
  Administered 2024-04-20: 25 mg via INTRAMUSCULAR
  Filled 2024-04-20: qty 2

## 2024-04-20 NOTE — [facility] Suicide Risk Assessment (Signed)
 [facility] INPATIENT:  Family/Significant Other Suicide Prevention Education  Suicide Prevention Education:  Contact Attempts: [name] (sister) [phone] , (name of family member/significant other) has been identified by the patient as the family member/significant other with whom the patient will be residing, and identified as the person(s) who will aid the patient in the event of a mental health crisis.  With written consent from the patient, two attempts were made to provide suicide prevention education, prior to and/or following the patient's discharge.  We were unsuccessful in providing suicide prevention education.  A suicide education pamphlet was given to the patient to share with family/significant other.  Date and time of second attempt: 04/20/2024/10:29 AM The phone rang but there was no answer.  The message said that the voicemail box hasn't been set up yet.  Leianna Barga O Dorthula Bier 04/20/2024, 10:29 AM

## 2024-04-20 NOTE — Group Note (Signed)
 LCSW Group Therapy Note   Group Date: 04/20/2024 Start Time: 1100 End Time: 1200   Participation:  patient was present and actively participated in the conversation.    Type of Therapy:  Group Therapy   Topic:  Stronger Together:  Building Healthy Relationships  Objective:  To explore loneliness, boundaries, and safe ways to build relationships.  Goals: Recognize healthy vs. unhealthy relationships. Learn safe ways to connect with others. Strengthen communication and [employer].  Summary:  Participants discussed loneliness, healthy connections, and setting boundaries. They explored safe ways to meet people and shared personal experiences. Key insights were reinforced through discussion and quotes.  Therapeutic Modalities Used: Cognitive Behavioral Therapy (CBT) Elements - Identifying unhealthy relationship patterns, challenging negative thoughts about connection. Dialectical Behavior Therapy (DBT) Elements - Interpersonal effectiveness, setting and maintaining boundaries. Supportive Group Therapy - Peer discussion, shared experiences, and emotional validation.   Essence Merle O Dandrea Widdowson, LCSWA 04/20/2024  4:58 PM

## 2024-04-20 NOTE — Progress Notes (Signed)
   04/19/24 2100  Psych Admission Type (Psych Patients Only)  Admission Status Voluntary  Psychosocial Assessment  Patient Complaints None  Eye Contact Fair  Facial Expression Other (Comment) (congruent)  Affect Appropriate to circumstance  Speech Logical/coherent  Interaction Assertive  Motor Activity Slow  Appearance/Hygiene Unremarkable  Behavior Characteristics Cooperative  Mood Pleasant  Aggressive Behavior  Effect No apparent injury  Thought Process  Coherency Circumstantial  Content Blaming self;Blaming others;Preoccupation  Delusions Paranoid;Persecutory  Perception WDL  Hallucination None reported or observed  Judgment Poor  Confusion None  Danger to Self  Current suicidal ideation? Denies  Agreement Not to Harm Self Yes  Description of Agreement verbal  Danger to Others  Danger to Others None reported or observed

## 2024-04-20 NOTE — Progress Notes (Addendum)
 [facility] MD Progress Note  04/20/2024 1:18 PM Mark Manning  MRN:  [id number]  Principal Problem: Substance induced mood disorder (HCC) Diagnosis: Principal Problem:   Substance induced mood disorder (HCC)  Reason for admission:  This is the first inpatient psychiatric mental health admission for this 43 year old African-American male to [facility] for  treatment of paranoia due to polysubstance use disorder.  Patient presented to [facility] behavioral health urgent care for evaluation of his mental health in the context of feeling unsafe in his neighborhood, reporting,  I was hacked during cyber attack and lost my job behind it. After medical evaluation / stabilization & clearance, he was transferred to the [facility] for further psychiatric evaluation & treatments.  BAL less than 15, UDS positive for cocaine and marijuana.   24-hour chart review: Vital signs reviewed without critical values.   PRNs required of nicotine gum for smoking cessation, hydroxyzine for anxiety, and trazodone for insomnia.  No agitation protocol required.  Patient's case discussed in the interdisciplinary team meeting.  Today's assessment notes: Mark Manning presents pleasant, cooperative, alert and oriented to person, place, time, and situation. He reports his mood is less depressed and rates depression as #1/10, with 10 being high severity.  Chart reviewed, findings shared with the treatment team and consult with attending psychiatrist.  Patient continues  on Prozac 20 mg p.o. daily for depression and anxiety.  Speech is clear, with normal volume and pattern.  Patient reports minimal preoccupation with paranoia and delusion. He continues on Risperdal disintegrating tablet 1 mg p.o. Q AM and 2 mg at bedtime.  Patient is to be started on Risperdal Consta 25 mg IM today, he'll need his oral Risperdal continued until outpatient followup, typically 3 weeks continuation although we would give him 30 days  and let the outpatient provider decide if he follows up.  We will continue to monitor therapeutic effect of Risperdal. Denies SI, HI, or AVH.  Edema to lower extremities resolving.  Encouraged to elevate both legs and a pillow while lying in bed.  Complaint of acid reflux, Protonix 40 mg p.o. daily initiated.    Reports that anxiety is at manageable level. Sleep is improving Appetite is good Concentration is better Energy level is adequate Denies suicidal thoughts.  Further denies suicidal intent and plan.  Denies having any HI.  Denies having psychotic symptoms.    Denies having side effects to current psychiatric medications.    We discussed changes to current medication regimen, of initiating Protonix 40 mg p.o. daily for acid reflux.  Patient is in agreement with medication adjustment.   Total Time spent with patient: 45 minutes  Past Psychiatric History:  Current/prior outpatient treatment: Denies, however, per chart review, patient has been seen at [facility] outpatient in 2019 Prior rehab hx: Denies Psychotherapy hx: Denies History of suicide: Denies History of homicide or aggression: Denies Psychiatric medication history: Denies Psychiatric medication compliance history: Denies being on psychotropic medications Neuromodulation history: Denies Current Psychiatrist: Denies Current therapist: Denies   Past Medical History: History reviewed. No pertinent past medical history. History reviewed. No pertinent surgical history. Family History: History reviewed. No pertinent family history. Family  Psychiatric  History:  See H&P Social History:  Social History   Substance and Sexual Activity  Alcohol Use Yes   Comment: occasionally     Social History   Substance and Sexual Activity  Drug Use Yes   Types: Cocaine, Marijuana   Comment: last use 01/18/18    Social  History   Socioeconomic History   Marital status: Married    Spouse name: Not on file   Number of children:  Not on file   Years of education: Not on file   Highest education level: Not on file  Occupational History   Not on file  Tobacco Use   Smoking status: Every Day    Current packs/day: 1.00    Types: Cigarettes   Smokeless tobacco: Never  Vaping Use   Vaping status: Former  Substance and Sexual Activity   Alcohol use: Yes    Comment: occasionally   Drug use: Yes    Types: Cocaine, Marijuana    Comment: last use 01/18/18   Sexual activity: Not on file  Other Topics Concern   Not on file  Social History Narrative   Not on file   Social Drivers of Health   Financial Resource Strain: Not on file  Food Insecurity: Food Insecurity Present (04/16/2024)   Hunger Vital Sign    Worried About Running Out of Food in the Last Year: Often true    Ran Out of Food in the Last Year: Often true  Transportation Needs: Unmet Transportation Needs (04/16/2024)   PRAPARE - Administrator, Civil Service (Medical): Yes    Lack of Transportation (Non-Medical): Yes  Physical Activity: Not on file  Stress: Not on file  Social Connections: Not on file   Additional Social History:    Sleep: Good  Appetite:  Good  Current Medications: Current Facility-Administered Medications  Medication Dose Route Frequency Provider Last Rate Last Admin   acetaminophen (TYLENOL) tablet 650 mg  650 mg Oral Q6H PRN Levester Reagin, NP   650 mg at 04/18/24 0915   FLUoxetine (PROZAC) capsule 20 mg  20 mg Oral Daily Novalee Horsfall C, FNP   20 mg at 04/20/24 0800   hydrOXYzine (ATARAX) tablet 25 mg  25 mg Oral TID PRN Laiden Milles C, FNP   25 mg at 04/19/24 2109   LORazepam (ATIVAN) injection 2 mg  2 mg Intramuscular TID PRN Levester Reagin, NP       nicotine polacrilex (NICORETTE) gum 2 mg  2 mg Oral PRN Jahayra Mazo C, FNP   2 mg at 04/19/24 2109   OLANZapine (ZYPREXA) injection 5 mg  5 mg Intramuscular TID PRN Levester Reagin, NP       OLANZapine zydis (ZYPREXA) disintegrating tablet 5 mg  5 mg Oral TID PRN  Levester Reagin, NP       pantoprazole (PROTONIX) EC tablet 40 mg  40 mg Oral Daily Lydia Toren C, FNP       risperiDONE (RISPERDAL M-TABS) disintegrating tablet 1 mg  1 mg Oral Daily Zouev, Dmitri, MD   1 mg at 04/20/24 0800   risperiDONE (RISPERDAL M-TABS) disintegrating tablet 2 mg  2 mg Oral QHS Zouev, Dmitri, MD       traZODone (DESYREL) tablet 50 mg  50 mg Oral QHS PRN Tyriana Helmkamp C, FNP   50 mg at 04/19/24 2109   Vitamin D (Ergocalciferol) (DRISDOL) 1.25 MG (50000 UNIT) capsule 50,000 Units  50,000 Units Oral Q7 days Deundra Bard, Dorinda Garland, FNP   50,000 Units at 04/18/24 1702   Lab Results:  Results for orders placed or performed during the hospital encounter of 04/16/24 (from the past 48 hours)  CBC with Differential/Platelet     Status: Abnormal   Collection Time: 04/19/24  6:48 AM  Result Value Ref Range   WBC  5.4 4.0 - 10.5 K/uL   RBC 4.30 4.22 - 5.81 MIL/uL   Hemoglobin 12.2 (L) 13.0 - 17.0 g/dL   HCT 16.1 09.6 - 04.5 %   MCV 90.7 80.0 - 100.0 fL   MCH 28.4 26.0 - 34.0 pg   MCHC 31.3 30.0 - 36.0 g/dL   RDW 40.9 81.1 - 91.4 %   Platelets 320 150 - 400 K/uL   nRBC 0.0 0.0 - 0.2 %   Neutrophils Relative % 58 %   Neutro Abs 3.1 1.7 - 7.7 K/uL   Lymphocytes Relative 31 %   Lymphs Abs 1.7 0.7 - 4.0 K/uL   Monocytes Relative 8 %   Monocytes Absolute 0.4 0.1 - 1.0 K/uL   Eosinophils Relative 2 %   Eosinophils Absolute 0.1 0.0 - 0.5 K/uL   Basophils Relative 1 %   Basophils Absolute 0.0 0.0 - 0.1 K/uL   Immature Granulocytes 0 %   Abs Immature Granulocytes 0.02 0.00 - 0.07 K/uL    Comment: Performed at [facility], [address], [place], [place] [address]  Comprehensive metabolic panel     Status: Abnormal   Collection Time: 04/19/24  6:48 AM  Result Value Ref Range   Sodium 137 135 - 145 mmol/L   Potassium 3.2 (L) 3.5 - 5.1 mmol/L   Chloride 106 98 - 111 mmol/L   CO2 23 22 - 32 mmol/L   Glucose, Bld 123 (H) 70 - 99 mg/dL    Comment: Glucose reference range  applies only to samples taken after fasting for at least 8 hours.   BUN 12 6 - 20 mg/dL   Creatinine, Ser 6.21 0.61 - 1.24 mg/dL   Calcium 8.6 (L) 8.9 - 10.3 mg/dL   Total Protein 6.3 (L) 6.5 - 8.1 g/dL   Albumin 3.2 (L) 3.5 - 5.0 g/dL   AST 30 15 - 41 U/L   ALT 27 0 - 44 U/L   Alkaline Phosphatase 56 38 - 126 U/L   Total Bilirubin 0.5 0.0 - 1.2 mg/dL   GFR, Estimated >30 >86 mL/min    Comment: (NOTE) Calculated using the CKD-EPI Creatinine Equation (2021)    Anion gap 8 5 - 15    Comment: Performed at [facility], [address], [place], [place] [address]   Blood Alcohol level:  Lab Results  Component Value Date   [facility] <15 04/16/2024   ETH <10 01/19/2018   Metabolic Disorder Labs: Lab Results  Component Value Date   HGBA1C 6.2 (H) 04/16/2024   MPG 131 04/16/2024   MPG 131 04/16/2024   No results found for: PROLACTIN Lab Results  Component Value Date   CHOL 126 04/16/2024   TRIG 88 04/16/2024   HDL 46 04/16/2024   CHOLHDL 2.7 04/16/2024   VLDL 18 04/16/2024   LDLCALC 62 04/16/2024   Physical Findings: AIMS:  , ,  ,  ,    CIWA:    COWS:     Musculoskeletal: Strength & Muscle Tone: within normal limits Gait & Station: normal Patient leans: N/A  Psychiatric Specialty Exam:  Presentation  General Appearance:  Appropriate for Environment; Casual; Fairly Groomed  Eye Contact: Good  Speech: Clear and Coherent  Speech Volume: Normal  Handedness: Right  Mood and Affect  Mood: Euthymic  Affect: Congruent  Thought Process  Thought Processes: Coherent; Goal Directed  Descriptions of Associations:Intact  Orientation:Full (Time, Place and Person)  Thought Content:Logical  History of Schizophrenia/Schizoaffective disorder:No  Duration of  Psychotic Symptoms:Greater than six months  Hallucinations:Hallucinations: None Description of Visual Hallucinations: Denies  Ideas of Reference:Paranoia (Improving)  Suicidal  Thoughts:Suicidal Thoughts: No  Homicidal Thoughts:Homicidal Thoughts: No  Sensorium  Memory: Immediate Good; Recent Good  Judgment: Fair  Insight: Fair  Art therapist  Concentration: Good  Attention Span: Good  Recall: Fair  Fund of Knowledge: Fair  Language: Good  Psychomotor Activity  Psychomotor Activity: Psychomotor Activity: Normal  Assets  Assets: Communication Skills; Physical Health; Resilience  Sleep  Sleep: Sleep: Good Number of Hours of Sleep: 6.5  Physical Exam: Physical Exam Vitals and nursing note reviewed.  Constitutional:      Appearance: He is normal weight.  HENT:     Head: Normocephalic.     Right Ear: External ear normal.     Left Ear: External ear normal.     Nose: Nose normal.     Mouth/Throat:     Mouth: Mucous membranes are moist.     Pharynx: Oropharynx is clear.   Eyes:     Extraocular Movements: Extraocular movements intact.    Cardiovascular:     Rate and Rhythm: Normal rate.     Pulses: Normal pulses.  Pulmonary:     Effort: Pulmonary effort is normal.  Abdominal:     Comments: Deferred  Genitourinary:    Comments: Deferred  Musculoskeletal:        General: Normal range of motion.     Cervical back: Normal range of motion.   Skin:    General: Skin is warm.   Neurological:     General: No focal deficit present.     Mental Status: He is alert and oriented to person, place, and time.   Psychiatric:        Mood and Affect: Mood normal.        Behavior: Behavior normal.        Thought Content: Thought content normal.    Review of Systems  Constitutional:  Negative for chills and fever.  HENT:  Negative for sore throat.   Eyes:  Negative for blurred vision.  Respiratory:  Negative for cough, sputum production, shortness of breath and wheezing.   Cardiovascular:  Negative for chest pain and palpitations.  Gastrointestinal:  Negative for heartburn, nausea and vomiting.  Genitourinary:  Negative  for dysuria, frequency and urgency.  Musculoskeletal:  Negative for falls.  Skin:  Negative for itching and rash.  Neurological:  Negative for dizziness and headaches.  Endo/Heme/Allergies:        See allergy listing.  Psychiatric/Behavioral:  Positive for depression. Negative for hallucinations, substance abuse and suicidal ideas. The patient is nervous/anxious. The patient does not have insomnia.    Blood pressure 128/80, pulse 79, temperature 98.5 F (36.9 C), temperature source Oral, resp. rate 14, height 5' 10 (1.778 m), weight 88.5 kg, SpO2 100%. Body mass index is 27.98 kg/m.  Treatment Plan Summary: Daily contact with patient to assess and evaluate symptoms and progress in treatment and Medication management Physician Treatment Plan for Secondary Diagnosis: Assessment:  This is the first inpatient psychiatric mental health admission for this 43 year old African-American male to [facility] for  treatment of paranoia due to polysubstance use disorder.  Patient presented to [facility] behavioral health urgent care for evaluation of his mental health in the context of feeling unsafe in his neighborhood, reporting,  I was hacked during cyber attack and lost my job behind it.     Principal Problem:   Substance induced mood  disorder (HCC)   Plan: Medication: --Continue Prozac 20 mg p.o. daily for depression and anxiety --Discontinue olanzapine 7.5 mg p.o. daily at bedtime for psychosis due to no n-effectiveness --Initiate Risperdal Consta 25 mg IM every 2 weeks, with first dose today. Continue oral Risperdal  until outpatient followup, typically 3 weeks continuation 04/20/2024. --Risperdal disintegrating tablet 1 mg p.o. every 12 hours for psychosis 04/18/24 --Increase Risperdal disintegrating tablet to 1 mg in AM and 2 mg PO at bedtime for psychosis 04/19/24 --Continue Risperdal disintegrating tab 1 mg p.o. every morning and 2 mg at bedtime for psychosis  and see several weeks and then Lamictal 1 month ago bed removed from doing what I thousand people and he was given names and names of doing the following clinical --Trazodone tablet 50 mg p.o. as needed at bedtime for insomnia --Hydroxyzine tablet 25 mg p.o. 3 times daily as needed for anxiety --Initiate Protonix 40 mg p.o. daily for GERD    Other PRN Medications  -Acetaminophen 650 mg every 6 as needed/mild pain  -Maalox 30 mL oral every 4 as needed/digestion  -Magnesium hydroxide 30 mL daily as needed/mild constipation    --The risks/benefits/side-effects/alternatives to this medication were discussed in detail with the patient and time was given for questions. The patient consents to medication trial.   -- Metabolic profile and EKG monitoring obtained while on an atypical antipsychotic (BMI: Lipid Panel: HbgA1c: QTc:)   -- Encouraged patient to participate in unit milieu and in scheduled group therapies    Continue BH Agitation Protocol  --Haldol 5 mg, oral, 3 times daily as needed, mild agitation  --Benadryl 50 mg, oral, 3 times daily as needed, mild agitation                                      OR   --Haldol injection 5 mg, IM, 3 times daily as needed, moderate agitation  --Benadryl injection 50 mg, IM, 3 times daily as needed, moderate agitation  --Ativan injection 2 mg, IM, 3 times daily as needed, moderate agitation                                      OR  --Haldol injection 10 mg, IM, 3 times daily as needed, severe agitation  --Benadryl injection 50 mg, IM, 3 times daily as needed, severe agitation  --Ativan injection 2 mg, IM, 3 times daily as needed, severe agitation    Admission labs reviewed: CMP: Glucose 105 elevated, albumin 3.4 low, otherwise normal.  Lipid profile: Within normal limits.  CBC with differential: RBC 4.16 low, hemoglobin 11.8 low, HCT 37.9 low, otherwise normal.  TSH: 0.501 normal.  BAL: Less than 15 normal.  UDS: Positive for cocaine, positive for  marijuana.   New labs ordered: Hemoglobin A1c:  6.2, vitamin D 25-hydroxy : Vitamin D is  17.61  EKG reviewed: Normal sinus rhythm, ventricular rate 79, QT/QTc 358/410.   Safety and Monitoring:  Voluntary admission to inpatient psychiatric unit for safety, stabilization and treatment  Daily contact with patient to assess and evaluate symptoms and progress in treatment  Patient's case to be discussed in multi-disciplinary team meeting  Observation Level : q15 minute checks  Vital signs: q12 hours  Precautions: suicide, but pt currently verbally contracts for safety on unit?    Discharge Planning:  Social work  and case management to assist with discharge planning and identification of hospital follow-up needs prior to discharge  Estimated LOS: 5-7?days  Discharge Concerns: Need to establish a safety plan; Medication compliance and effectiveness  Discharge Goals: Return home with outpatient referrals for mental health follow-up including medication management/psychotherapy.     Laurence Pons, FNP 04/20/2024, 1:18 PM Patient ID: Mark Manning, male   DOB: 1981/10/21, 43 y.o.   MRN: [id number] Patient ID: Mark Manning, male   DOB: Oct 13, 1981, 43 y.o.   MRN: [id number]

## 2024-04-20 NOTE — Group Note (Signed)
 Therapy Group Note  Group Topic:Other  Group Date: 04/20/2024 Start Time: 1400 End Time: 1430 Facilitators: Takila Kronberg G, OT    The primary objective of this topic is to explore and understand the concept of occupational balance in the context of daily living. The term occupational balance is defined broadly, encompassing all activities that occupy an individual's time and energy, including self-care, leisure, and work-related tasks. The goal is to guide participants towards achieving a harmonious blend of these activities, tailored to their personal values and life circumstances. This balance is aimed at enhancing overall well-being, not by equally distributing time across activities, but by ensuring that daily engagements are fulfilling and not draining. The content delves into identifying various barriers that individuals face in achieving occupational balance, such as overcommitment, misaligned priorities, external pressures, and lack of effective time management. The impact of these barriers on occupational performance, roles, and lifestyles is examined, highlighting issues like reduced efficiency, strained relationships, and potential health problems. Strategies for cultivating occupational balance are a key focus. These strategies include practical methods like time blocking, prioritizing tasks, establishing self-care rituals, decluttering, connecting with nature, and engaging in reflective practices. These approaches are designed to be adaptable and applicable to a wide range of life scenarios, promoting a proactive and mindful approach to daily living. The overall aim is to equip participants with the knowledge and tools to create a balanced lifestyle that supports their mental, emotional, and physical health, thereby improving their functional performance in daily life.     Participation Level: Engaged   Participation Quality: Independent   Behavior: Appropriate   Speech/Thought  Process: Relevant   Affect/Mood: Appropriate   Insight: Fair   Judgement: Fair      Modes of Intervention: Education  Patient Response to Interventions:  Attentive   Plan: Continue to engage patient in OT groups 2 - 3x/week.  04/20/2024  Lynnda Sas, OT  Keiva Dina, OT

## 2024-04-20 NOTE — Progress Notes (Signed)
 Collateral contact - [facility] [phone]  Moira Andrews said that patient had been admitted to to the program on Saturday, 04/22/2024.   Dashonda Bonneau, LCSWA 04/20/2024

## 2024-04-20 NOTE — [facility] Suicide Risk Assessment (Signed)
 [facility] INPATIENT:  Family/Significant Other Suicide Prevention Education  Suicide Prevention Education:  Education Completed; Mark Manning (brother) [phone],  (name of family member/significant other) has been identified by the patient as the family member/significant other with whom the patient will be residing, and identified as the person(s) who will aid the patient in the event of a mental health crisis (suicidal ideations/suicide attempt).  With written consent from the patient, the family member/significant other has been provided the following suicide prevention education, prior to the and/or following the discharge of the patient.  Brother said that patient can stay overnight at his home, and then go to [facility] in [place].  Brother said he will pick up patient tomorrow, Friday, 04/21/2024 at 11:00 AM.  Brother said that patient doesn't have any guns and weapons and doesn't have access to any guns and weapons.  He said no one in the home has any guns or weapons.  He said he will secure medications and sharp objects (such as knives and scissors).  He doesn't have any safety concerns about patient coming home.    The following people live in the home:  brother and his girlfriend.   The suicide prevention education provided includes the following: Suicide risk factors Suicide prevention and interventions National Suicide Hotline telephone number [facility] assessment telephone number [facility] Emergency Assistance 911 [facility] and/or Residential Mobile Crisis Unit telephone number  Request made of family/significant other to: Remove weapons (e.g., guns, rifles, knives), all items previously/currently identified as safety concern.   Remove drugs/medications (over-the-counter, prescriptions, illicit drugs), all items previously/currently identified as a safety concern.  The family member/significant other verbalizes understanding of the suicide  prevention education information provided.  The family member/significant other agrees to remove the items of safety concern listed above.  Aziza Stuckert O Shelsy Seng, LCSWA 04/20/2024, 3:29 PM

## 2024-04-20 NOTE — Progress Notes (Signed)
 Pharmacy: possible [employer] does not cover uzedy. Pt will not have access to medication after discharge  Saunders Curio

## 2024-04-20 NOTE — [facility] Group Notes (Signed)
 [facility] Group Notes:  (Nursing/MHT/Case Management/Adjunct)  Date:  04/20/2024  Time:  9:41 PM  Type of Therapy:  Wrap-up group  Participation Level:  Active  Participation Quality:  Appropriate  Affect:  Appropriate  Cognitive:  Appropriate  Insight:  Appropriate  Engagement in Group:  Engaged  Modes of Intervention:  Education  Summary of Progress/Problems: Goal D/C met. Rated day 9/10.  Mark Manning 04/20/2024, 9:41 PM

## 2024-04-20 NOTE — Plan of Care (Signed)
   Problem: Education: Goal: Knowledge of [facility] Education information/materials will improve Outcome: Progressing Goal: Emotional status will improve Outcome: Progressing Goal: Mental status will improve Outcome: Progressing Goal: Verbalization of understanding the information provided will improve Outcome: Progressing

## 2024-04-20 NOTE — Progress Notes (Signed)
   04/20/24 0800  Psych Admission Type (Psych Patients Only)  Admission Status Voluntary  Psychosocial Assessment  Patient Complaints None  Eye Contact Fair  Facial Expression Other (Comment) (congruent)  Affect Appropriate to circumstance  Speech Logical/coherent  Interaction Assertive  Motor Activity Slow  Appearance/Hygiene Unremarkable  Behavior Characteristics Cooperative  Mood Pleasant  Thought Process  Coherency WDL  Content WDL  Delusions None reported or observed  Perception WDL  Hallucination None reported or observed  Judgment Poor  Confusion None  Danger to Self  Current suicidal ideation? Denies  Agreement Not to Harm Self Yes  Description of Agreement verbal  Danger to Others  Danger to Others None reported or observed

## 2024-04-21 DIAGNOSIS — F1994 Other psychoactive substance use, unspecified with psychoactive substance-induced mood disorder: Secondary | ICD-10-CM | POA: Diagnosis not present

## 2024-04-21 MED ORDER — RISPERIDONE 2 MG PO TABS: 2.0000 mg | ORAL_TABLET | Freq: Every day | ORAL | Status: DC

## 2024-04-21 MED ORDER — HYDROXYZINE HCL 25 MG PO TABS: 25.0000 mg | ORAL_TABLET | Freq: Three times a day (TID) | ORAL | 0 refills | Status: AC | PRN

## 2024-04-21 MED ORDER — FLUOXETINE HCL 20 MG PO CAPS: 20.0000 mg | ORAL_CAPSULE | Freq: Every day | ORAL | 0 refills | Status: AC

## 2024-04-21 MED ORDER — RISPERIDONE MICROSPHERES ER 25 MG IM SRER: 25.0000 mg | INTRAMUSCULAR | 1 refills | Status: AC

## 2024-04-21 MED ORDER — PANTOPRAZOLE SODIUM 40 MG PO TBEC: 40.0000 mg | DELAYED_RELEASE_TABLET | Freq: Every day | ORAL | 0 refills | Status: AC

## 2024-04-21 MED ORDER — NICOTINE POLACRILEX 2 MG MT GUM: 2.0000 mg | CHEWING_GUM | OROMUCOSAL | 0 refills | Status: AC | PRN

## 2024-04-21 MED ORDER — RISPERIDONE 1 MG PO TABS
1.0000 mg | ORAL_TABLET | Freq: Every day | ORAL | Status: DC
  Filled 2024-04-21: qty 21

## 2024-04-21 MED ORDER — TRAZODONE HCL 50 MG PO TABS: 50.0000 mg | ORAL_TABLET | Freq: Every evening | ORAL | 0 refills | Status: AC | PRN

## 2024-04-21 MED ORDER — RISPERIDONE 2 MG PO TABS: ORAL_TABLET | ORAL | 0 refills | Status: AC

## 2024-04-21 NOTE — BHH Suicide Risk Assessment (Signed)
 [facility] Discharge Suicide Risk Assessment   Principal Problem: Substance induced mood disorder (HCC) Discharge Diagnoses: Principal Problem:   Substance induced mood disorder (HCC)   Total Time spent with patient: 30 minutes  Musculoskeletal: Strength & Muscle Tone: within normal limits Gait & Station: normal Patient leans: N/A  Psychiatric Specialty Exam  Presentation  General Appearance:  Appropriate for Environment  Eye Contact: Good  Speech: Clear and Coherent  Speech Volume: Normal  Handedness: Right   Mood and Affect  Mood: Euthymic  Duration of Depression Symptoms: Greater than two weeks  Affect: Appropriate   Thought Process  Thought Processes: Goal Directed  Descriptions of Associations:Intact  Orientation:Full (Time, Place and Person)  Thought Content:Logical  History of Schizophrenia/Schizoaffective disorder:No  Duration of Psychotic Symptoms:Greater than six months  Hallucinations:Hallucinations: None Description of Visual Hallucinations: Denies  Ideas of Reference:None  Suicidal Thoughts:Suicidal Thoughts: No  Homicidal Thoughts:Homicidal Thoughts: No   Sensorium  Memory: Immediate Fair  Judgment: Fair  Insight: Fair   Art therapist  Concentration: Fair  Attention Span: Fair  Recall: [employer] of Knowledge: Fair  Language: Fair   Psychomotor Activity  Psychomotor Activity: Psychomotor Activity: Normal   Assets  Assets: Desire for Improvement; Communication Skills; Housing; Resilience   Sleep  Sleep: Sleep: Fair  Estimated Sleeping Duration (Last 24 Hours): 7.00 hours  Physical Exam: Physical Exam ROS Blood pressure 115/80, pulse 70, temperature 98.8 F (37.1 C), temperature source Oral, resp. rate 17, height 5' 10 (1.778 m), weight 88.5 kg, SpO2 98%. Body mass index is 27.98 kg/m.  Mental Status Per Nursing Assessment::   On Admission:  NA  Demographic Factors:  Male and Low  socioeconomic status  Loss Factors: Decline in physical health, Legal issues, and Financial problems/change in socioeconomic status  Historical Factors: NA  Risk Reduction Factors:   Sense of responsibility to family, Living with another person, especially a relative, Positive social support, and Positive coping skills or problem solving skills  Continued Clinical Symptoms:  Unstable or Poor Therapeutic Relationship  Cognitive Features That Contribute To Risk:  None    Suicide Risk:  Minimal: No identifiable suicidal ideation.  Patients presenting with no risk factors but with morbid ruminations; may be classified as minimal risk based on the severity of the depressive symptoms  Patient denies SI or HI for >48 hours. Denies wanting to be dead and future oriented. SRA complete and acute risk for suicide is low.   [place] Suicide Risk assessment:  1. Do you wish to be dead? NONE REPORTED 2. Have you wished your dead or wished you could go to sleep and not wake up? NONE REPORTED 3.  Have you actually had thoughts of killing yourself?  NONE REPORTED 4.  Have you been thinking about how you might do this?  NONE REPORTED 5.  Have you had these thoughts and some intention of acting on them? NONE REPORTED 6.  Have you started to work out or worked out the details to kill yourself? NONE REPORTED 7.  Do you intend to carry out this plan? NONE REPORTED 8. On a scale of 1-5 with 1 being the least severe and 5 being the most severe answer the following questions place for intensity of ideation. ZERO 9. How many times have you had these thoughts? NONE REPORTED 10. When you have the thoughts how long to the last?  NONE REPORTED 11. Control ability.  Could you or can you stop thinking about killing herself or wanting to die if you  want to?  YES 12. Are there any things anyone or anything family religion pain of death that stop you from wanting to die or acting on thoughts of committing suicide?   FAMILY 13.  What sort of reason to do have to think about wanting to die or killing yourself? NONE REPORTED 14.Was it to end the pain or stop the way you are feeling in other words you could not go on living with his pain or how you are feeling or was not to get attention revenge or reaction from others?  Or both?  NONE REPORTED  [place] Suicide Risk assessment:  1. Do you wish to be dead? NONE REPORTED 2. Have you wished your dead or wished you could go to sleep and not wake up? NONE REPORTED 3.  Have you actually had thoughts of killing yourself?  NONE REPORTED 4.  Have you been thinking about how you might do this?  NONE REPORTED 5.  Have you had these thoughts and some intention of acting on them? NONE REPORTED 6.  Have you started to work out or worked out the details to kill yourself? NONE REPORTED 7.  Do you intend to carry out this plan? NONE REPORTED 8. On a scale of 1-5 with 1 being the least severe and 5 being the most severe answer the following questions place for intensity of ideation. ZERO 9. How many times have you had these thoughts? NONE REPORTED 10. When you have the thoughts how long to the last?  NONE REPORTED 11. Control ability.  Could you or can you stop thinking about killing herself or wanting to die if you want to?  YES 12. Are there any things anyone or anything family religion pain of death that stop you from wanting to die or acting on thoughts of committing suicide?  FAMILY 13.  What sort of reason to do have to think about wanting to die or killing yourself? NONE REPORTED 14.Was it to end the pain or stop the way you are feeling in other words you could not go on living with his pain or how you are feeling or was not to get attention revenge or reaction from others?  Or both?  NONE REPORTED    Follow-up Information     [facility] Go on 05/08/2024.   Why: You have an appointment for medication management services on 05/08/24 at 3:50 pm, in person.  Please  bring your medication with you for your injection. Contact information: [address] [place] [address] [phone]         [facility] Go on 04/25/2024.   Specialty: Professional Counselor Why: Please go to this provider on 04/25/24 at 9:00 am for an assessment, to obtain therapy services.  You may also go Monday through Friday, from 9 am to 1 pm. Contact information: [address] [place]  [address] [place] [address] [phone]                 Plan Of Care/Follow-up recommendations:  Outpatient followup as above  Kiyoko Mcguirt, MD 04/21/2024, 9:39 AM

## 2024-04-21 NOTE — Group Note (Signed)
 Recreation Therapy Group Note   Group Topic:Problem Solving  Group Date: 04/21/2024 Start Time: 1610 End Time: 1000 Facilitators: Gunnard Dorrance-McCall, LRT,CTRS Location: [address] Dayroom   Group Topic: Problem Solving  Goal Area(s) Addresses:  Patient will effectively work in a team with other group members. Patient will verbalize importance of using appropriate problem solving techniques.   Behavioral Response: Engaged  Intervention: Worksheet  Activity: Dentist. Patients were given two worksheets of brain teasers. Patients got 15 minutes to complete the puzzles. Patients could work with each other if they chose to to figure out what each puzzle was. At the end of the 15 minutes, LRT would go over the answers with the group.     Education: Journalist, newspaper, Communication, Team Building  Education Outcome: Acknowledges understanding/In group clarification offered/Needs additional education.    Affect/Mood: Appropriate   Participation Level: Engaged   Participation Quality: Independent   Behavior: Appropriate   Speech/Thought Process: Focused   Insight: Good   Judgement: Good   Modes of Intervention: Activity   Patient Response to Interventions:  Engaged   Education Outcome:  In group clarification offered    Clinical Observations/Individualized Feedback: Pt attended and participated in group. Pt was engaged and attentive throughout session. Pt made suggestions to the puzzles presented. Pt was called out of group and returned after group ended.     Plan: Continue to engage patient in RT group sessions 2-3x/week.   Zahli Vetsch-McCall, LRT,CTRS 04/21/2024 1:01 PM

## 2024-04-21 NOTE — [facility] Suicide Risk Assessment (Addendum)
 [facility] INPATIENT:  Family/Significant Other Suicide Prevention Education  Suicide Prevention Education:  Education Completed; Mark Manning (friend) [phone], (name of family member/significant other) has been identified by the patient as the family member/significant other with whom the patient will be residing, and identified as the person(s) who will aid the patient in the event of a mental health crisis (suicidal ideations/suicide attempt).  With written consent from the patient, the family member/significant other has been provided the following suicide prevention education, prior to the and/or following the discharge of the patient.  Friend said that her address is [address], [place], [place].  She said patient can stay there overnight.  She said she gets off work at 3:00 PM, and will be home when patient arrives at 3:30 PM.  Friend said that they don't have any guns or weapons.  She said patient doesn't have any guns or weapons.  She will secure medications and sharp objects.  She doesn't have any safety concerns about patient returning home.  The suicide prevention education provided includes the following: Suicide risk factors Suicide prevention and interventions National Suicide Hotline telephone number [facility] assessment telephone number [facility] Emergency Assistance 911 [facility] and/or Residential Mobile Crisis Unit telephone number  Request made of family/significant other to: Remove weapons (e.g., guns, rifles, knives), all items previously/currently identified as safety concern.   Remove drugs/medications (over-the-counter, prescriptions, illicit drugs), all items previously/currently identified as a safety concern.  The family member/significant other verbalizes understanding of the suicide prevention education information provided.  The family member/significant other agrees to remove the items of safety concern listed above.  Mark Manning,  LCSWA 04/21/2024, 2:24 PM

## 2024-04-21 NOTE — Progress Notes (Addendum)
  [facility] Discharge Plan :  Will you be returning to the same living situation after discharge:  Patient had an apartment in [place], [place], but is in the process of eviction, so he will stay one overnight with his brother, Misty Amour.  On Saturday, 04/22/2024, he will go to [facility].  Note:  plans changed; brother didn't pick up patient, so patient will go to his friend's home, [place].  At discharge, do you have transportation home?: Yes,  patient's brother, Tavious Griesinger will pick him up at 11 AM.  Note:  patient's brother Misty Amour, didn't arrive.   [facility]W attempted to call him but he didn't respond.  Brother called the front desk and informed that he changed his mind and wouldn't pick him up.  Note:  plans changed:  CSW arranged transportation through [facility] to patient's friend's home, [place]. Do you have the ability to pay for your medications: Yes, patient has insurance.  He will also receive samples.  Release of information consent forms completed and in the chart;  Patient's signature needed at discharge.  Patient to Follow up at:  Follow-up Information     [facility] Go on 05/08/2024.   Why: You have an appointment for medication management services on 05/08/24 at 3:50 pm, in person.  Please bring your medication with you for your injection. Contact information: [address] [place] [address] [phone]         [facility] Go on 04/25/2024.   Specialty: Professional Counselor Why: Please go to this provider on 04/25/24 at 9:00 am for an assessment, to obtain therapy services.  You may also go Monday through Friday, from 9 am to 1 pm. Contact information: [address] [place]  [address] [place] [address] [phone]                 Next level of care provider has access to [facility] Link:no  Safety Planning and Suicide Prevention discussed: Yes,  Loyce Klasen (brother)  [phone], Jinx Mourning (sister) [phone] and [place] (friend) [phone]  Has patient been referred to the Quitline?: Yes, referral was submitted (Quitline website).  Patient was given a print-out.  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  Tomorrow (Saturday, 04/23/2023) patient will go to [facility].   Rachel Samples O Mindi Akerson, LCSWA 04/21/2024, 9:32 AM

## 2024-04-21 NOTE — Progress Notes (Addendum)
 Conversation with patient:    Patient called [facility]. Patient's new address effective Saturday, 6/14:  [address], [place], [place], [place] [address]  Pharmacy near [facility] (patient's new pharmacy): [facility], [address], [place], [place] [address]   Anelisse Jacobson, LCSWA 04/21/2024

## 2024-04-21 NOTE — Progress Notes (Signed)
 D: Pt A & O X 3. Denies SI, HI, AVH and pain at this time. D/C home as ordered. Cab voucher given for transportation home. A: D/C instructions reviewed with pt including prescriptions, medication samples and follow up appointments; compliance encouraged. All belongings from locker 12 returned to pt at time of departure. Scheduled medications given with verbal education and effects monitored. Safety checks maintained without incident till time of d/c.  R: Pt receptive to care. Compliant with medications when offered. Denies adverse drug reactions when assessed. Verbalized understanding related to d/c instructions. Signed belonging sheet in agreement with items received from locker. Ambulatory with a steady gait. Appears to be in no physical distress at time of departure.

## 2024-04-21 NOTE — Group Note (Signed)
 Date:  04/21/2024 Time:  10:42 AM  Group Topic/Focus:  Goals Group:   The focus of this group is to help patients establish daily goals to achieve during treatment and discuss how the patient can incorporate goal setting into their daily lives to aide in recovery.    Participation Level:  Did Not Attend  Participation Quality:    Affect:    Cognitive:    Insight:   Engagement in Group:    Modes of Intervention:    Additional Comments:  Pt were aware of group time. Pt refused to attend.  Tita Form 04/21/2024, 10:42 AM

## 2024-04-21 NOTE — Transportation (Signed)
 04/21/2024  Bolling Bushy DOB: 08-03-1981 MRN: [id number]   RIDER WAIVER AND RELEASE OF LIABILITY  For the purposes of helping with transportation needs, [facility] partners with outside transportation providers (taxi companies, , Catering manager.) to give [employer] patients or other approved people the choice of on-demand rides Public librarian) to our buildings for non-emergency visits.  By using [employer], I, the person signing this document, on behalf of myself and/or any legal minors (in my care using the [employer]), agree:  Science writer given to me are supplied by independent, outside transportation providers who do not work for, or have any affiliation with, [employer]. [place] is not a transportation company. [facility] has no control over the quality or safety of the rides I get using [employer]. [facility] has no control over whether any outside ride will happen on time or not. [facility] gives no guarantee on the reliability, quality, safety, or availability on any rides, or that no mistakes will happen. I know and accept that traveling by vehicle (car, truck, SVU, Carloyn Chi, bus, taxi, etc.) has risks of serious injuries such as disability, being paralyzed, and death. I know and agree the risk of using [employer] is mine alone, and not [employer]. [employer] are provided as is and as are available. The transportation providers are in charge for all inspections and care of the vehicles used to provide these rides. I agree not to take legal action against [facility], its agents, employees, officers, directors, representatives, insurers, attorneys, assigns, successors, subsidiaries, and affiliates at any time for any reasons related directly or indirectly to using [employer]. I also agree not to take legal action against [facility] or its affiliates for any injury, death, or damage to property caused by or related to  using [employer]. I have read this Waiver and Release of Liability, and I understand the terms used in it and their legal meaning. This Waiver is freely and voluntarily given with the understanding that my right (or any legal minors) to legal action against [facility] relating to [employer] is knowingly given up to use these services.   I attest that I read the Ride Waiver and Release of Liability to Algis N Swiss, gave Mr. [place] the opportunity to ask questions and answered the questions asked (if any). I affirm that Baraka N Tooley then provided consent for assistance with transportation.    Jaylenne Hamelin, LCSWA 04/21/2024

## 2024-04-21 NOTE — Progress Notes (Signed)
   04/21/24 0245  Psych Admission Type (Psych Patients Only)  Admission Status Voluntary  Psychosocial Assessment  Patient Complaints None  Eye Contact Fair  Facial Expression Anxious  Affect Appropriate to circumstance  Speech Logical/coherent  Interaction Assertive  Motor Activity Slow  Appearance/Hygiene Unremarkable  Behavior Characteristics Cooperative  Mood Pleasant  Aggressive Behavior  Effect No apparent injury  Thought Process  Coherency WDL  Content WDL  Delusions WDL  Perception WDL  Hallucination None reported or observed  Judgment WDL  Confusion WDL  Danger to Self  Current suicidal ideation? Denies  Danger to Others  Danger to Others None reported or observed

## 2024-04-21 NOTE — Discharge Summary (Incomplete)
 Physician Discharge Summary Note  Patient:  Mark Manning is an 43 y.o., male MRN:  [id number] DOB:  04-16-1981 Patient phone:  [phone] (home)  Patient address:   [address] [place] [address],  Total Time spent with patient: 30 minutes  Date of Admission:  04/16/2024 Date of Discharge: 04/21/24  Reason for Admission:   As per H&P: This is the first inpatient psychiatric mental health admission for this 43 year old African-American male to [facility] for  treatment of paranoia due to polysubstance use disorder.  Patient presented to [facility] behavioral health urgent care for evaluation of his mental health in the context of feeling unsafe in his neighborhood, reporting,  I was hacked during cyber attack and lost my job behind it. After medical evaluation / stabilization & clearance, he was transferred to the [facility] for further psychiatric evaluation & treatments.  BAL less than 15, UDS positive for cocaine and marijuana.   As per chart review, ZOE NORDIN was seen and treated at [facility], ED for crack cocaine abuse and marijuana abuse in 2019.  No indication of psychotropic medication noted in chart.  I go   During this assessment, Mark Manning reports he felt paranoid and believed that people are following him stating there was a Cyber attack and his phone was hacked.  Reports people followed him from his apartment, he had to go to his grandmother's house and knocked at her door.  He reports persistent discomfort due to the sensation of being monitored by the people and unable to live in his apartment due to the people watching his movement and whereabouts.  He expresses that these individuals are monitoring him.  Reports they have sophisticated equipment that will not make them to be caught if they wish to harm or kill him.  Patient added, these people lied on me and making false stories about me.  He reports difficulty sleeping,  and as a result, he walks aimlessly to avoid detection and his feet are swollen due to walking.  He had to go to the hospital for checkup and he was given potassium.  He reports feeling frustrated because nobody believes what he is saying and thinking he is crazy.  Reports his mother, uncle, and aunt arrived at his grandmother's house and requested him to be taken to a mental health clinic for evaluation.   Mark Manning endorses being in jail due to possession of crack cocaine from April 11 to Apr 06, 2024.  Reports currently being on probation and have a court date on 04/18/2024 for eviction from his apartment.  He reports feeling of sadness, depressed mood, anhedonia, fatigue, difficulty concentrating, worthlessness, hopelessness, for the past 2 weeks since he came out of jail and being, 'followed by these people.'  He reports increasing anxiety and worrying too much.  He denies symptoms of mania, PTSD, or OCD.  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  Principal Problem: Substance induced mood disorder (HCC) Discharge Diagnoses: Principal Problem:   Substance induced mood disorder (HCC) Delusional disorder  Past Psychiatric History:  Previous Psych Diagnoses: Polysubstance use disorder Prior inpatient treatment: Denies Current/prior outpatient treatment: Denies, however, per chart review, patient has been seen at [facility] outpatient in 2019 Prior rehab hx: Denies Psychotherapy hx: Denies History of suicide: Denies History of homicide or aggression: Denies Psychiatric medication history: Denies Psychiatric medication compliance history: Denies being  on psychotropic medications Neuromodulation history: Denies Current Psychiatrist: Denies Current therapist: The  Past Medical History: History reviewed. No pertinent past medical history. History reviewed. No pertinent surgical history. Family  History: History reviewed. No pertinent family history. Family Psychiatric  History: Medical: Unsure Psych: Unsure Psych Rx: Unsure SA/HA: Unsure Substance use family hx: Unsure Social History:  Social History   Substance and Sexual Activity  Alcohol Use Yes   Comment: occasionally     Social History   Substance and Sexual Activity  Drug Use Yes   Types: Cocaine, Marijuana   Comment: last use 01/18/18    Social History   Socioeconomic History   Marital status: Married    Spouse name: Not on file   Number of children: Not on file   Years of education: Not on file   Highest education level: Not on file  Occupational History   Not on file  Tobacco Use   Smoking status: Every Day    Current packs/day: 1.00    Types: Cigarettes   Smokeless tobacco: Never  Vaping Use   Vaping status: Former  Substance and Sexual Activity   Alcohol use: Yes    Comment: occasionally   Drug use: Yes    Types: Cocaine, Marijuana    Comment: last use 01/18/18   Sexual activity: Not on file  Other Topics Concern   Not on file  Social History Narrative   Not on file   Social Drivers of Health   Financial Resource Strain: Not on file  Food Insecurity: Food Insecurity Present (04/16/2024)   Hunger Vital Sign    Worried About Running Out of Food in the Last Year: Often true    Ran Out of Food in the Last Year: Often true  Transportation Needs: Unmet Transportation Needs (04/16/2024)   PRAPARE - Administrator, Civil Service (Medical): Yes    Lack of Transportation (Non-Medical): Yes  Physical Activity: Not on file  Stress: Not on file  Social Connections: Not on file    Hospital Course:   Patient was admitted to inpatient psychiatry at [facility] for safety and stabilization. Patient was provided safe and therapeutic milieu, psychiatric and medical assessment, care and treatment, as well as support from nursing, behavioral health staff. Both psychotherapy and  psychoeducation groups were provided. Different coping skills such as journaling, CBT and art therapy groups were offered. Additional consultation was provided by hospitalist for H&P and medical needs.  Patient was started on Risperidone   during the admission for stimulant induced psychosis and delusional disorder. Patient was organized and was not responding to internal stimuli but had persistent delusions that required titration of Risperdal  for several days. Delusions resolved with Rispredal 1 mg daily and 2 mg at bedtime. Patient was not agitated or behaving bizarrely throughout admission. Denied SI or HI. Patient agreed to LAI of Risperdal  consta which he received on 04/20/24 with good effect and tolerance and was provided a 3 weeks script of oral overlap for medication. Given cocaine and cannabis abuse contributing he was provided a rehab referral for day after discharge on Saturday at [facility]. Patient tolerated without side effects and medications were titrated to therapeutic effect. As patient stabilized on medications and participated in therapeutic interventions, symptoms began to improve. Patient was not paranoid on the unit and interactive with peers and staff and felt safe on the unit throughout admission. Only referred to unknown men outside the hospital. He denied any hallucinations and was not observed to be  responding to internal stimuli throughout hospitalization.  On the day of discharge, the chart was reviewed, case was discussed with staff and the patient was seen in person. Patient's overall mood has improved. Patient was calm and cooperative and did not appear anxious. Patient reported adequate sleep and stable mood. Patient was tolerating medications well without side effects reported or noted. Patient denied suicidal ideation, plan or intent, denied hopelessness, helplessness or worthlessness, and denied homicidal ideation. Insight and judgement have improved. Patient demonstrated future  orientation and was motivated to follow-up with aftercare. Patient was encouraged to be adherent with medications. Patient was instructed to call 911, ask for help to go to the closest emergency room or crisis center, call crisis hotlines for help if in critical status or when symptoms were worsening. Patient voiced understanding of this information. At the time of discharge, patient had reached maximum benefit from hospitalization, was no longer considered to be dangerous to self or others, and was psychiatrically stable and otherwise appropriate for discharge to less restrictive care in the community.   Medical Hospital Course: Patient was seen by the hospitalist for routine admission examination. Medications for chronic conditions were continued.  Medical hospital course was otherwise unremarkable.    Musculoskeletal: Strength & Muscle Tone: within normal limits Gait & Station: normal Patient leans: N/A   Psychiatric Specialty Exam:  Presentation  General Appearance:  Appropriate for Environment  Eye Contact: Good  Speech: Clear and Coherent  Speech Volume: Normal  Handedness: Right   Mood and Affect  Mood: Euthymic  Affect: Appropriate   Thought Process  Thought Processes: Goal Directed  Descriptions of Associations:Intact  Orientation:Full (Time, Place and Person)  Thought Content:Logical  History of Schizophrenia/Schizoaffective disorder:No  Duration of Psychotic Symptoms:Greater than six months  Hallucinations:Hallucinations: None Description of Visual Hallucinations: Denies  Ideas of Reference:None  Suicidal Thoughts:Suicidal Thoughts: No  Homicidal Thoughts:Homicidal Thoughts: No   Sensorium  Memory: Immediate Fair  Judgment: Fair  Insight: Fair   Art therapist  Concentration: Fair  Attention Span: Fair  Recall: [employer] of Knowledge: Fair  Language: Fair   Psychomotor Activity  Psychomotor  Activity: Psychomotor Activity: Normal   Assets  Assets: Desire for Improvement; Communication Skills; Housing; Resilience   Sleep  Sleep: Sleep: Fair  Estimated Sleeping Duration (Last 24 Hours): 7.00 hours   Physical Exam: General: Well developed, well nourished.  Pupils: Normal at 3mm Respiratory: Breathing is unlabored.  Cardiovascular: No edema.  Language: No anomia, no aphasia Muscle strength and tone-pt moving all extremities.  Gait not assessed as pt remained in bed.  Neuro: Facial muscles are symmetric. Pt without tremor, no evidence of hyperarousal.  Review of Systems  Constitutional: Negative.   HENT: Negative.    Eyes: Negative.   Respiratory: Negative.    Cardiovascular: Negative.   Gastrointestinal: Negative.   Genitourinary: Negative.   Musculoskeletal: Negative.   Skin: Negative.   Neurological: Negative.   Endo/Heme/Allergies: Negative.   Psychiatric/Behavioral: Negative.     Blood pressure 115/80, pulse 70, temperature 98.8 F (37.1 C), temperature source Oral, resp. rate 17, height 5' 10 (1.778 m), weight 88.5 kg, SpO2 98%. Body mass index is 27.98 kg/m.   Social History   Tobacco Use  Smoking Status Every Day   Current packs/day: 1.00   Types: Cigarettes  Smokeless Tobacco Never   Tobacco Cessation:  A prescription for an FDA-approved tobacco cessation medication provided at discharge   Blood Alcohol level:  Lab Results  Component Value Date  ETH <15 04/16/2024   ETH <10 01/19/2018    Metabolic Disorder Labs:  Lab Results  Component Value Date   HGBA1C 6.2 (H) 04/16/2024   MPG 131 04/16/2024   MPG 131 04/16/2024   No results found for: PROLACTIN Lab Results  Component Value Date   CHOL 126 04/16/2024   TRIG 88 04/16/2024   HDL 46 04/16/2024   CHOLHDL 2.7 04/16/2024   VLDL 18 04/16/2024   LDLCALC 62 04/16/2024    See Psychiatric Specialty Exam and Suicide Risk Assessment completed by Attending Physician prior to  discharge.  Discharge destination:  Home  Is patient on multiple antipsychotic therapies at discharge:  No   Has Patient had three or more failed trials of antipsychotic monotherapy by history:  No  Recommended Plan for Multiple Antipsychotic Therapies: NA   Allergies as of 04/21/2024       Reactions   Clindamycin/lincomycin    Swelling     Med Rec must be completed prior to using this [facility]***       Follow-up Information     [facility] Go on 05/08/2024.   Why: You have an appointment for medication management services on 05/08/24 at 3:50 pm, in person.  Please bring your medication with you for your injection. Contact information: [address] [place] [address] [phone]         [facility] Go on 04/25/2024.   Specialty: Professional Counselor Why: Please go to this provider on 04/25/24 at 9:00 am for an assessment, to obtain therapy services.  You may also go Monday through Friday, from 9 am to 1 pm. Contact information: [address] [place]  [address] [place] [address] [phone]                 Follow-up recommendations:  outpatient psychiatric followup in 2  weeks as noted above, patient is due for next LAI of Risperdal  consta on 6/26 and provided 3 weeks of oral Risperdal  overlap of 1 mg qdaily and 2 mg qhs  Signed: Zuriyah Shatz, MD 04/21/2024, 9:41 AM

## 2024-04-21 NOTE — Plan of Care (Signed)
   Problem: Education: Goal: Emotional status will improve Outcome: Progressing Goal: Mental status will improve Outcome: Progressing   Problem: Activity: Goal: Interest or engagement in activities will improve Outcome: Progressing

## 2024-04-22 DIAGNOSIS — F22 Delusional disorders: Principal | ICD-10-CM | POA: Diagnosis present

## 2024-05-28 DIAGNOSIS — F17210 Nicotine dependence, cigarettes, uncomplicated: Secondary | ICD-10-CM | POA: Diagnosis not present

## 2024-05-28 DIAGNOSIS — W231XXA Caught, crushed, jammed, or pinched between stationary objects, initial encounter: Secondary | ICD-10-CM | POA: Diagnosis not present

## 2024-05-28 DIAGNOSIS — M79644 Pain in right finger(s): Secondary | ICD-10-CM | POA: Diagnosis not present

## 2024-05-28 DIAGNOSIS — S60311A Abrasion of right thumb, initial encounter: Secondary | ICD-10-CM | POA: Diagnosis not present

## 2024-05-29 ENCOUNTER — Emergency Department (HOSPITAL_COMMUNITY): Payer: Self-pay

## 2024-05-29 ENCOUNTER — Other Ambulatory Visit: Payer: Self-pay

## 2024-05-29 ENCOUNTER — Emergency Department (HOSPITAL_COMMUNITY)
Admission: EM | Admit: 2024-05-29 | Discharge: 2024-05-29 | Disposition: A | Discharge status: Home / Self Care | Payer: Self-pay | Attending: Emergency Medicine | Admitting: Emergency Medicine

## 2024-05-29 ENCOUNTER — Encounter (HOSPITAL_COMMUNITY): Payer: Self-pay | Admitting: Emergency Medicine

## 2024-05-29 DIAGNOSIS — S6991XA Unspecified injury of right wrist, hand and finger(s), initial encounter: Secondary | ICD-10-CM

## 2024-05-29 NOTE — ED Triage Notes (Signed)
 Pt state his right thumb got slammed in door. Bleeding controlled at this time.

## 2024-05-29 NOTE — Discharge Instructions (Signed)
 You were evaluated in the Emergency Department and after careful evaluation, we did not find any emergent condition requiring admission or further testing in the hospital.  Your exam/testing today is overall reassuring.  X-ray did not show any broken bones.  Would recommend Tylenol  or Motrin for any lingering discomfort.  Please return to the Emergency Department if you experience any worsening of your condition.   Thank you for allowing us  to be a part of your care.

## 2024-05-29 NOTE — ED Provider Notes (Signed)
 [facility]-EMERGENCY DEPT [facility] Emergency Department Provider Note MRN:  [id number]  Arrival date & time: 05/29/24     Chief Complaint   Hand Pain   History of Present Illness   Mark Manning is a 43 y.o. year-old male with no pertinent past medical history presenting to the ED with chief complaint of hand pain.  Trauma to right thumb, slammed in a door, no other injuries or complaints.  Review of Systems  A thorough review of systems was obtained and all systems are negative except as noted in the HPI and PMH.   Patient's Health History   History reviewed. No pertinent past medical history.  History reviewed. No pertinent surgical history.  History reviewed. No pertinent family history.  Social History   Socioeconomic History   Marital status: Married    Spouse name: Not on file   Number of children: Not on file   Years of education: Not on file   Highest education level: Not on file  Occupational History   Not on file  Tobacco Use   Smoking status: Every Day    Current packs/day: 1.00    Types: Cigarettes   Smokeless tobacco: Never  Vaping Use   Vaping status: Former  Substance and Sexual Activity   Alcohol use: Yes    Comment: occasionally   Drug use: Yes    Types: Cocaine, Marijuana    Comment: last use 01/18/18   Sexual activity: Not on file  Other Topics Concern   Not on file  Social History Narrative   Not on file   Social Drivers of Health   Financial Resource Strain: Not on file  Food Insecurity: Food Insecurity Present (04/16/2024)   Hunger Vital Sign    Worried About Running Out of Food in the Last Year: Often true    Ran Out of Food in the Last Year: Often true  Transportation Needs: Unmet Transportation Needs (04/16/2024)   PRAPARE - Administrator, Civil Service (Medical): Yes    Lack of Transportation (Non-Medical): Yes  Physical Activity: Not on file  Stress: Not on file  Social Connections: Not on file  Intimate  Partner Violence: Not At Risk (04/16/2024)   Humiliation, Afraid, Rape, and Kick questionnaire    Fear of Current or Ex-Partner: No    Emotionally Abused: No    Physically Abused: No    Sexually Abused: No     Physical Exam   Vitals:   05/29/24 0020  BP: 98/70  Pulse: 76  Resp: 16  Temp: 97.8 F (36.6 C)  SpO2: 100%    CONSTITUTIONAL: Well-appearing, NAD NEURO/PSYCH:  Alert and oriented x 3, no focal deficits EYES:  eyes equal and reactive ENT/NECK:  no LAD, no JVD CARDIO: Regular rate, well-perfused, normal S1 and S2 PULM:  CTAB no wheezing or rhonchi GI/GU:  non-distended, non-tender MSK/SPINE:  No gross deformities, no edema SKIN:  no rash, atraumatic   *Additional and/or pertinent findings included in MDM below  Diagnostic and Interventional Summary    EKG Interpretation Date/Time:    Ventricular Rate:    PR Interval:    QRS Duration:    QT Interval:    QTC Calculation:   R Axis:      Text Interpretation:         Labs Reviewed - No data to display  DG Finger Thumb Right  Final Result      Medications - No data to display   Procedures  /  Critical Care Procedures  ED Course and Medical Decision Making  Initial Impression and Ddx Small abrasion to the distal tip of the right thumb, x-ray to exclude fracture.  No other injuries or complaints.  Past medical/surgical history that increases complexity of ED encounter: None  Interpretation of Diagnostics I personally reviewed the thumb x-ray and my interpretation is as follows: No fracture    Patient Reassessment and Ultimate Disposition/Management     Discharge  Patient management required discussion with the following services or consulting groups:  None  Complexity of Problems Addressed Acute complicated illness or Injury  Additional Data Reviewed and Analyzed Further history obtained from: None  Additional Factors Impacting ED Encounter Risk None  Ozell HERO. Theadore, MD [facility]  Emergency Medicine [facility] [email or web address]  Final Clinical Impressions(s) / ED Diagnoses     ICD-10-CM   1. Injury of right thumb, initial encounter  [phone]       ED Discharge Orders     None        Discharge Instructions Discussed with and Provided to Patient:     Discharge Instructions      You were evaluated in the Emergency Department and after careful evaluation, we did not find any emergent condition requiring admission or further testing in the hospital.  Your exam/testing today is overall reassuring.  X-ray did not show any broken bones.  Would recommend Tylenol  or Motrin for any lingering discomfort.  Please return to the Emergency Department if you experience any worsening of your condition.   Thank you for allowing us  to be a part of your care.       Theadore Ozell HERO, MD 05/29/24 [phone]

## 2024-12-03 ENCOUNTER — Encounter (HOSPITAL_COMMUNITY): Payer: Self-pay

## 2024-12-03 ENCOUNTER — Emergency Department (HOSPITAL_COMMUNITY): Payer: Self-pay

## 2024-12-03 ENCOUNTER — Other Ambulatory Visit: Payer: Self-pay

## 2024-12-03 ENCOUNTER — Emergency Department (HOSPITAL_COMMUNITY)
Admission: EM | Admit: 2024-12-03 | Discharge: 2024-12-03 | Disposition: A | Discharge status: Home / Self Care | Payer: Self-pay | Attending: Emergency Medicine | Admitting: Emergency Medicine

## 2024-12-03 DIAGNOSIS — E860 Dehydration: Secondary | ICD-10-CM

## 2024-12-03 DIAGNOSIS — R42 Dizziness and giddiness: Secondary | ICD-10-CM | POA: Insufficient documentation

## 2024-12-03 DIAGNOSIS — R55 Syncope and collapse: Secondary | ICD-10-CM | POA: Insufficient documentation

## 2024-12-03 LAB — COMPREHENSIVE METABOLIC PANEL WITH GFR
ALT: 14 U/L (ref 0–44)
AST: 19 U/L (ref 15–41)
Albumin: 4.2 g/dL (ref 3.5–5.0)
Alkaline Phosphatase: 44 U/L (ref 38–126)
Anion gap: 12 (ref 5–15)
BUN: 10 mg/dL (ref 6–20)
CO2: 28 mmol/L (ref 22–32)
Calcium: 8.5 mg/dL — ABNORMAL LOW (ref 8.9–10.3)
Chloride: 100 mmol/L (ref 98–111)
Creatinine, Ser: 1.32 mg/dL — ABNORMAL HIGH (ref 0.61–1.24)
GFR, Estimated: >60 mL/min
Glucose, Bld: 129 mg/dL — ABNORMAL HIGH (ref 70–99)
Potassium: 3.6 mmol/L (ref 3.5–5.1)
Sodium: 140 mmol/L (ref 135–145)
Total Bilirubin: 0.6 mg/dL (ref 0.0–1.2)
Total Protein: 6.5 g/dL (ref 6.5–8.1)

## 2024-12-03 LAB — CBC WITH DIFFERENTIAL/PLATELET
Abs Immature Granulocytes: 0.01 10*3/uL (ref 0.00–0.07)
Basophils Absolute: 0 10*3/uL (ref 0.0–0.1)
Basophils Relative: 1 %
Eosinophils Absolute: 0 10*3/uL (ref 0.0–0.5)
Eosinophils Relative: 0 %
HCT: 46.7 % (ref 39.0–52.0)
Hemoglobin: 15.3 g/dL (ref 13.0–17.0)
Immature Granulocytes: 0 %
Lymphocytes Relative: 30 %
Lymphs Abs: 1.8 10*3/uL (ref 0.7–4.0)
MCH: 29.4 pg (ref 26.0–34.0)
MCHC: 32.8 g/dL (ref 30.0–36.0)
MCV: 89.6 fL (ref 80.0–100.0)
Monocytes Absolute: 0.4 10*3/uL (ref 0.1–1.0)
Monocytes Relative: 7 %
Neutro Abs: 3.6 10*3/uL (ref 1.7–7.7)
Neutrophils Relative %: 62 %
Platelets: 331 10*3/uL (ref 150–400)
RBC: 5.21 MIL/uL (ref 4.22–5.81)
RDW: 14.6 % (ref 11.5–15.5)
WBC: 5.8 10*3/uL (ref 4.0–10.5)
nRBC: 0 % (ref 0.0–0.2)

## 2024-12-03 LAB — D-DIMER, QUANTITATIVE: D-Dimer, Quant: <0.27 ug{FEU}/mL (ref 0.00–0.50)

## 2024-12-03 LAB — MAGNESIUM: Magnesium: 2.5 mg/dL — ABNORMAL HIGH (ref 1.7–2.4)

## 2024-12-03 LAB — TROPONIN T, HIGH SENSITIVITY: Troponin T High Sensitivity: 17 ng/L (ref 0–19)

## 2024-12-03 MED ORDER — LACTATED RINGERS IV BOLUS
1000.0000 mL | Freq: Once | INTRAVENOUS | Status: AC
  Administered 2024-12-03: 1000 mL via INTRAVENOUS

## 2024-12-03 NOTE — ED Triage Notes (Signed)
 Pt via [facility] c/o dizziness and feelings of passing out while sitting in car. Positive orthostatics per ems. Pt states that that he has not been drinking water like he should be. Initial BP was in the 80s. IV placed in R AC and fluids started.

## 2024-12-03 NOTE — ED Provider Notes (Signed)
 " [facility] AT [facility] Provider Note   CSN: [id number] Arrival date & time: 12/03/24  1613     Patient presents with: Near Syncope   Mark Manning is a 44 y.o. male.  {Add pertinent medical, surgical, social history, OB history to HPI:32947} Patient is a 44 year old male who presents emergency department with a chief complaint of sudden onset of dizziness, sensation as though he was going to pass out, and believes that he may have had a true syncopal event.  Patient notes he felt as though he was having an anxiety attack and notes that he believes that he passed out.  He notes that this has happened numerous times in the past.  He notes he does feel back to his baseline at this time with no active dizziness, lightheadedness, numbness, paresthesias or unilateral weakness.  He denies any associated chest pain or shortness of breath.  He has had no associated palpitations.  EMS noted that he was orthostatic and patient does note that he has not been drinking like he is supposed to.  He notes he does not take any prescription medications daily.   Near Syncope       Prior to Admission medications  Medication Sig Start Date End Date Taking? Authorizing Provider  FLUoxetine  (PROZAC ) 20 MG capsule Take 1 capsule (20 mg total) by mouth daily. 04/22/24   Zouev, Dmitri, MD  hydrOXYzine  (ATARAX ) 25 MG tablet Take 1 tablet (25 mg total) by mouth 3 (three) times daily as needed for anxiety. 04/21/24   Zouev, Dmitri, MD  nicotine  polacrilex (NICORETTE ) 2 MG gum Take 1 each (2 mg total) by mouth as needed for smoking cessation. 04/21/24   Zouev, Dmitri, MD  pantoprazole  (PROTONIX ) 40 MG tablet Take 1 tablet (40 mg total) by mouth daily. 04/22/24   Zouev, Dmitri, MD  risperiDONE  (RISPERDAL ) 2 MG tablet Take 0.5 tabs (1 mg) in the morning daily and 1 tab (2mg ) every night 04/21/24   Zouev, Dmitri, MD  risperiDONE  microspheres (RISPERDAL  CONSTA) 25 MG injection Inject 2  mLs (25 mg total) into the muscle every 14 (fourteen) days. 04/21/24   Zouev, Dmitri, MD  traZODone  (DESYREL ) 50 MG tablet Take 1 tablet (50 mg total) by mouth at bedtime as needed for sleep. 04/21/24   Zouev, Dmitri, MD    Allergies: Clindamycin/lincomycin    Review of Systems  Cardiovascular:  Positive for near-syncope.  Neurological:  Positive for light-headedness.  All other systems reviewed and are negative.   Updated Vital Signs BP 90/65   Wt 88.9 kg   BMI 28.12 kg/m   Physical Exam Vitals and nursing note reviewed.  Constitutional:      General: He is not in acute distress.    Appearance: Normal appearance. He is not ill-appearing.  HENT:     Head: Normocephalic and atraumatic.     Nose: Nose normal.     Mouth/Throat:     Mouth: Mucous membranes are moist.  Eyes:     Extraocular Movements: Extraocular movements intact.     Conjunctiva/sclera: Conjunctivae normal.     Pupils: Pupils are equal, round, and reactive to light.  Cardiovascular:     Rate and Rhythm: Normal rate and regular rhythm.     Pulses: Normal pulses.     Heart sounds: Normal heart sounds. No murmur heard.    No gallop.  Pulmonary:     Effort: Pulmonary effort is normal. No respiratory distress.     Breath sounds:  Normal breath sounds. No stridor. No wheezing, rhonchi or rales.  Abdominal:     General: Abdomen is flat. Bowel sounds are normal. There is no distension.     Palpations: Abdomen is soft.     Tenderness: There is no abdominal tenderness. There is no guarding.  Musculoskeletal:        General: No swelling, tenderness, deformity or signs of injury. Normal range of motion.     Cervical back: Normal range of motion and neck supple. No rigidity or tenderness.     Right lower leg: No edema.     Left lower leg: No edema.  Skin:    General: Skin is warm and dry.     Findings: No rash.  Neurological:     General: No focal deficit present.     Mental Status: He is alert and oriented to  person, place, and time. Mental status is at baseline.     Cranial Nerves: No cranial nerve deficit.     Sensory: No sensory deficit.     Motor: No weakness.     Coordination: Coordination normal.     Gait: Gait normal.  Psychiatric:        Mood and Affect: Mood normal.        Behavior: Behavior normal.        Thought Content: Thought content normal.        Judgment: Judgment normal.     (all labs ordered are listed, but only abnormal results are displayed) Labs Reviewed  COMPREHENSIVE METABOLIC PANEL WITH GFR  CBC WITH DIFFERENTIAL/PLATELET  MAGNESIUM   TROPONIN T, HIGH SENSITIVITY    EKG: None  Radiology: No results found.  {Document cardiac monitor, telemetry assessment procedure when appropriate:32947} Procedures   Medications Ordered in the ED  lactated ringers  bolus 1,000 mL (has no administration in time range)      {Click here for ABCD2, HEART and other calculators REFRESH Note before signing:1}                              Medical Decision Making Amount and/or Complexity of Data Reviewed Labs: ordered.   ***  {Document critical care time when appropriate  Document review of labs and clinical decision tools ie CHADS2VASC2, etc  Document your independent review of radiology images and any outside records  Document your discussion with family members, caretakers and with consultants  Document social determinants of health affecting pt's care  Document your decision making why or why not admission, treatments were needed:32947:::1}   Final diagnoses:  None    ED Discharge Orders     None        "

## 2024-12-03 NOTE — ED Notes (Signed)
 Patient transported to CT

## 2024-12-03 NOTE — Discharge Instructions (Addendum)
 It was our pleasure to provide your ER care today - we hope that you feel better.  Drink plenty of fluids/stay well hydrated.   Follow up closely with primary care doctor in the coming week.  Return to ER if worse, new symptoms, fevers, new/severe pain, chest pain, trouble breathing, weak/fainting, or other concern.
